# Patient Record
Sex: Male | Born: 1960 | Race: White | Hispanic: No | Marital: Married | Smoking: Never smoker
Health system: Southern US, Community
[De-identification: ages and names within clinical notes are randomized; demographics above are authoritative.]

## PROBLEM LIST (undated history)

## (undated) DIAGNOSIS — Z800 Family history of malignant neoplasm of digestive organs: Secondary | ICD-10-CM

## (undated) HISTORY — PX: APPENDECTOMY: SHX54

---

## 2004-01-31 ENCOUNTER — Emergency Department (HOSPITAL_COMMUNITY): Admission: EM | Admit: 2004-01-31 | Discharge: 2004-01-31 | Payer: Self-pay | Admitting: Emergency Medicine

## 2005-08-30 ENCOUNTER — Ambulatory Visit (HOSPITAL_COMMUNITY): Admission: RE | Admit: 2005-08-30 | Discharge: 2005-08-30 | Payer: Self-pay | Admitting: Family Medicine

## 2007-04-20 ENCOUNTER — Emergency Department (HOSPITAL_COMMUNITY): Admission: EM | Admit: 2007-04-20 | Discharge: 2007-04-20 | Payer: Self-pay | Admitting: Emergency Medicine

## 2010-12-15 ENCOUNTER — Emergency Department (HOSPITAL_COMMUNITY)
Admission: EM | Admit: 2010-12-15 | Discharge: 2010-12-15 | Payer: Self-pay | Source: Home / Self Care | Admitting: Family Medicine

## 2013-01-14 ENCOUNTER — Telehealth: Payer: Self-pay

## 2013-01-14 ENCOUNTER — Other Ambulatory Visit: Payer: Self-pay

## 2013-01-14 DIAGNOSIS — Z139 Encounter for screening, unspecified: Secondary | ICD-10-CM

## 2013-01-15 ENCOUNTER — Other Ambulatory Visit: Payer: Self-pay

## 2013-01-15 NOTE — Telephone Encounter (Signed)
Opened in error

## 2013-01-19 ENCOUNTER — Telehealth: Payer: Self-pay

## 2013-01-19 NOTE — Telephone Encounter (Signed)
Gastroenterology Pre-Procedure Form   Request Date: 01/18/2013    Requesting Physician: Dr. Sherwood Gambler      PATIENT INFORMATION:  Jonathan Serrano is a 52 y.o., male (DOB=1961/08/09).  PROCEDURE: Procedure(s) requested: colonoscopy Procedure Reason: screening for colon cancer  PATIENT REVIEW QUESTIONS: The patient reports the following:   1. Diabetes Melitis: no 2. Joint replacements in the past 12 months: no 3. Major health problems in the past 3 months: no 4. Has an artificial valve or MVP:no 5. Has been advised in past to take antibiotics in advance of a procedure like teeth cleaning: no}    MEDICATIONS & ALLERGIES:    Patient reports the following regarding taking any blood thinners:   Plavix? no Aspirin? YES Coumadin?  no  Patient confirms/reports the following medications:  Current Outpatient Prescriptions  Medication Sig Dispense Refill  . aspirin 81 MG tablet Take 81 mg by mouth daily.       No current facility-administered medications for this visit.    Patient confirms/reports the following allergies:  Allergies  Allergen Reactions  . Penicillins Hives    Patient is appropriate to schedule for requested procedure(s): yes  AUTHORIZATION INFORMATION Primary Insurance:   ID #:   Group #:  Pre-Cert / Auth required Pre-Cert / Auth #:   Secondary Insurance:   ID #:   Group #:  Pre-Cert / Auth required:} Pre-Cert / Auth #:   No orders of the defined types were placed in this encounter.    SCHEDULE INFORMATION: Procedure has been scheduled as follows:  Date: 02/03/2013     Time: 8:30 AM  Location: [facility] Short Stay  This Gastroenterology Pre-Precedure Form is being routed to the following provider(s) for review: R. Roetta Sessions, MD

## 2013-01-19 NOTE — Telephone Encounter (Signed)
Appropriate for procedure.  No need to hold ASA.

## 2013-01-20 ENCOUNTER — Telehealth: Payer: Self-pay

## 2013-01-20 MED ORDER — PEG-KCL-NACL-NASULF-NA ASC-C 100 G PO SOLR: 1.0000 | ORAL | Status: DC

## 2013-01-20 NOTE — Telephone Encounter (Signed)
Rx sent to [facility] and instructions mailed to pt.

## 2013-01-20 NOTE — Telephone Encounter (Signed)
I called [phone] ( [employer]) and spoke to [place] who said that a PA is not required for a screening colonoscopy.

## 2013-01-25 ENCOUNTER — Encounter (HOSPITAL_COMMUNITY): Payer: Self-pay | Admitting: Pharmacy Technician

## 2013-02-03 ENCOUNTER — Encounter (HOSPITAL_COMMUNITY)
Admission: RE | Disposition: A | Discharge status: Home / Self Care | Payer: Self-pay | Source: Ambulatory Visit | Attending: Internal Medicine

## 2013-02-03 ENCOUNTER — Ambulatory Visit (HOSPITAL_COMMUNITY)
Admission: RE | Admit: 2013-02-03 | Discharge: 2013-02-03 | Disposition: A | Discharge status: Home / Self Care | Payer: Managed Care, Other (non HMO) | Source: Ambulatory Visit | Attending: Internal Medicine | Admitting: Internal Medicine

## 2013-02-03 ENCOUNTER — Encounter (HOSPITAL_COMMUNITY): Payer: Self-pay | Admitting: *Deleted

## 2013-02-03 DIAGNOSIS — Z800 Family history of malignant neoplasm of digestive organs: Secondary | ICD-10-CM | POA: Insufficient documentation

## 2013-02-03 DIAGNOSIS — Z139 Encounter for screening, unspecified: Secondary | ICD-10-CM

## 2013-02-03 DIAGNOSIS — Z1211 Encounter for screening for malignant neoplasm of colon: Secondary | ICD-10-CM

## 2013-02-03 HISTORY — DX: Family history of malignant neoplasm of digestive organs: Z80.0

## 2013-02-03 HISTORY — PX: COLONOSCOPY: SHX5424

## 2013-02-03 SURGERY — COLONOSCOPY
Anesthesia: Moderate Sedation

## 2013-02-03 MED ORDER — MEPERIDINE HCL 100 MG/ML IJ SOLN
INTRAMUSCULAR | Status: DC | PRN
  Administered 2013-02-03: 50 mg via INTRAVENOUS
  Administered 2013-02-03: 25 mg via INTRAVENOUS

## 2013-02-03 MED ORDER — ONDANSETRON HCL 4 MG/2ML IJ SOLN
INTRAMUSCULAR | Status: AC
  Filled 2013-02-03: qty 2

## 2013-02-03 MED ORDER — ONDANSETRON HCL 4 MG/2ML IJ SOLN
INTRAMUSCULAR | Status: DC | PRN
  Administered 2013-02-03: 4 mg via INTRAVENOUS

## 2013-02-03 MED ORDER — MEPERIDINE HCL 100 MG/ML IJ SOLN
INTRAMUSCULAR | Status: AC
  Filled 2013-02-03: qty 2

## 2013-02-03 MED ORDER — MIDAZOLAM HCL 5 MG/5ML IJ SOLN
INTRAMUSCULAR | Status: AC
  Filled 2013-02-03: qty 10

## 2013-02-03 MED ORDER — MIDAZOLAM HCL 5 MG/5ML IJ SOLN
INTRAMUSCULAR | Status: DC | PRN
  Administered 2013-02-03: 2 mg via INTRAVENOUS
  Administered 2013-02-03: 1 mg via INTRAVENOUS
  Administered 2013-02-03: 2 mg via INTRAVENOUS

## 2013-02-03 MED ORDER — STERILE WATER FOR IRRIGATION IR SOLN
Status: DC | PRN
  Administered 2013-02-03: 08:00:00

## 2013-02-03 MED ORDER — SODIUM CHLORIDE 0.45 % IV SOLN
INTRAVENOUS | Status: DC
  Administered 2013-02-03: 09:00:00 via INTRAVENOUS

## 2013-02-03 NOTE — H&P (Signed)
  Primary Care Physician:  Cassell Smiles., MD Primary Gastroenterologist:  Dr. Jena Gauss  Pre-Procedure History & Physical: HPI:  Jonathan Serrano is a 52 y.o. male is here for a screening colonoscopy.  No prior colonoscopy. No bowel symptoms. Positive family history of colon cancer in patient's father but at an advanced age (55).  Past Medical History  Diagnosis Date  . Family history of colon cancer     father    Past Surgical History  Procedure Laterality Date  . Appendectomy      Prior to Admission medications   Medication Sig Start Date End Date Taking? Authorizing Provider  aspirin 81 MG tablet Take 81 mg by mouth daily.   Yes Historical Provider, MD  peg 3350 powder (MOVIPREP) 100 G SOLR Take 1 kit (100 g total) by mouth as directed. 01/20/13  Yes Corbin Ade, MD    Allergies as of 01/15/2013  . (Not on File)    History reviewed. No pertinent family history.  History   Social History  . Marital Status: Married    Spouse Name: N/A    Number of Children: N/A  . Years of Education: N/A   Occupational History  . Not on file.   Social History Main Topics  . Smoking status: Never Smoker   . Smokeless tobacco: Not on file  . Alcohol Use: Yes     Comment: occasional  . Drug Use: No  . Sexually Active: Not on file   Other Topics Concern  . Not on file   Social History Narrative  . No narrative on file    Review of Systems: See HPI, otherwise negative ROS  Physical Exam: BP 139/83  Pulse 86  Temp(Src) 97.8 F (36.6 C) (Oral)  Resp 18  Ht 5\' 11"  (1.803 m)  Wt 175 lb (79.379 kg)  BMI 24.42 kg/m2  SpO2 97% General:   Alert,  Well-developed, well-nourished, pleasant and cooperative in NAD Head:  Normocephalic and atraumatic. Eyes:  Sclera clear, no icterus.   Conjunctiva pink. Ears:  Normal auditory acuity. Nose:  No deformity, discharge,  or lesions. Mouth:  No deformity or lesions, dentition normal. Neck:  Supple; no masses or thyromegaly. Lungs:   Clear throughout to auscultation.   No wheezes, crackles, or rhonchi. No acute distress. Heart:  Regular rate and rhythm; no murmurs, clicks, rubs,  or gallops. Abdomen:  Positive bowel sounds. Soft nontender blockers we'll mass or organomegaly Msk:  Symmetrical without gross deformities. Normal posture. Pulses:  Normal pulses noted. Extremities:  Without clubbing or edema. Neurologic:  Alert and  oriented x4;  grossly normal neurologically. Skin:  Intact without significant lesions or rashes. Cervical Nodes:  No significant cervical adenopathy. Psych:  Alert and cooperative. Normal mood and affect.  Impression/Plan: Jonathan Serrano is now here to undergo a screening colonoscopy. This will be patient's first ever average risk screening examination.  Risks, benefits, limitations, imponderables and alternatives regarding colonoscopy have been reviewed with the patient. Questions have been answered. All parties agreeable.

## 2013-02-03 NOTE — Op Note (Signed)
[facility] [address] [place], [address]   COLONOSCOPY PROCEDURE REPORT  PATIENT: Jonathan Serrano, Jonathan Serrano  MR#:         [id number] BIRTHDATE: [birth year] , 51  yrs. old GENDER: Male ENDOSCOPIST: R.  Roetta Sessions, MD FACP FACG REFERRED BY:  Artis Delay, M.D. PROCEDURE DATE:  02/03/2013 PROCEDURE:     Screening ileocolonoscopy  INDICATIONS: First-ever average risk screening examination (father with colon cancer but at an advanced age)  INFORMED CONSENT:  The risks, benefits, alternatives and imponderables including but not limited to bleeding, perforation as well as the possibility of a missed lesion have been reviewed.  The potential for biopsy, lesion removal, etc. have also been discussed.  Questions have been answered.  All parties agreeable. Please see the history and physical in the medical record for more information.  MEDICATIONS: Versed 5 mg IV and Demerol 75 mg IV in divided doses. Zofran 4 mg IV  DESCRIPTION OF PROCEDURE:  After a digital rectal exam was performed, the Pentax Colonoscope [id number]  colonoscope was advanced from the anus through the rectum and colon to the area of the cecum, ileocecal valve and appendiceal orifice.  The cecum was deeply intubated.  These structures were well-seen and photographed for the record.  From the level of the cecum and ileocecal valve, the scope was slowly and cautiously withdrawn.  The mucosal surfaces were carefully surveyed utilizing scope tip deflection to facilitate fold flattening as needed.  The scope was pulled down into the rectum where a thorough examination including retroflexion was performed.    FINDINGS:  Adequate preparation. Normal rectum. Normal-appearing colonic mucosa (surgical changes at appendectomy site). Distal 10 cm of terminal ileal mucosa appeared normal.  THERAPEUTIC / DIAGNOSTIC MANEUVERS PERFORMED:  none  COMPLICATIONS: None  CECAL WITHDRAWAL TIME:  8 minutes  IMPRESSION:   Normal ileocolonoscopy-status post appendectomy  RECOMMENDATIONS: Repeat screening colonoscopy in 10 years   _______________________________ eSigned:  R. Roetta Sessions, MD FACP [facility] 02/03/2013 9:16 AM   CC:    PATIENT NAME:  Jonathan Serrano, Jonathan Serrano MR#: [id number]

## 2013-02-05 ENCOUNTER — Encounter (HOSPITAL_COMMUNITY): Payer: Self-pay | Admitting: Internal Medicine

## 2017-12-11 DIAGNOSIS — Z6827 Body mass index (BMI) 27.0-27.9, adult: Secondary | ICD-10-CM | POA: Diagnosis not present

## 2017-12-11 DIAGNOSIS — Z1389 Encounter for screening for other disorder: Secondary | ICD-10-CM | POA: Diagnosis not present

## 2017-12-11 DIAGNOSIS — Z0000 Encounter for general adult medical examination without abnormal findings: Secondary | ICD-10-CM | POA: Diagnosis not present

## 2017-12-11 DIAGNOSIS — E663 Overweight: Secondary | ICD-10-CM | POA: Diagnosis not present

## 2019-01-14 DIAGNOSIS — Z1389 Encounter for screening for other disorder: Secondary | ICD-10-CM | POA: Diagnosis not present

## 2019-01-14 DIAGNOSIS — E663 Overweight: Secondary | ICD-10-CM | POA: Diagnosis not present

## 2019-01-14 DIAGNOSIS — Z6827 Body mass index (BMI) 27.0-27.9, adult: Secondary | ICD-10-CM | POA: Diagnosis not present

## 2019-01-14 DIAGNOSIS — Z0000 Encounter for general adult medical examination without abnormal findings: Secondary | ICD-10-CM | POA: Diagnosis not present

## 2019-02-16 DIAGNOSIS — R972 Elevated prostate specific antigen [PSA]: Secondary | ICD-10-CM | POA: Diagnosis not present

## 2019-03-02 DIAGNOSIS — R972 Elevated prostate specific antigen [PSA]: Secondary | ICD-10-CM | POA: Diagnosis not present

## 2019-10-01 ENCOUNTER — Other Ambulatory Visit: Payer: Self-pay

## 2019-10-01 DIAGNOSIS — Z20822 Contact with and (suspected) exposure to covid-19: Secondary | ICD-10-CM

## 2019-10-03 ENCOUNTER — Telehealth: Payer: Self-pay

## 2019-10-03 NOTE — Telephone Encounter (Signed)
Pt called to check for covid results- advised results are not back yet.

## 2019-10-04 LAB — NOVEL CORONAVIRUS, NAA

## 2019-10-28 ENCOUNTER — Telehealth: Payer: Self-pay

## 2019-10-28 NOTE — Telephone Encounter (Signed)
Joey, PT from [facility] called requesting verbal orders for HHPT 1wk3. Orders approved and given per discharge summary.

## 2019-10-28 NOTE — Addendum Note (Signed)
Addended by: Jasmine December T on: 10/28/2019 01:08 PM   Modules accepted: Level of Service, SmartSet

## 2019-10-28 NOTE — Telephone Encounter (Signed)
Entered in error This encounter was created in error - please disregard. 

## 2021-03-07 ENCOUNTER — Ambulatory Visit: Payer: Managed Care, Other (non HMO) | Admitting: Urology

## 2021-03-13 ENCOUNTER — Other Ambulatory Visit: Payer: Self-pay

## 2021-03-13 ENCOUNTER — Encounter: Payer: Self-pay | Admitting: Urology

## 2021-03-13 ENCOUNTER — Ambulatory Visit: Payer: Managed Care, Other (non HMO) | Admitting: Urology

## 2021-03-13 ENCOUNTER — Ambulatory Visit (INDEPENDENT_AMBULATORY_CARE_PROVIDER_SITE_OTHER): Payer: No Typology Code available for payment source | Admitting: Urology

## 2021-03-13 VITALS — BP 134/73 | HR 71 | Temp 98.4°F | Ht 70.0 in | Wt 182.0 lb

## 2021-03-13 DIAGNOSIS — R972 Elevated prostate specific antigen [PSA]: Secondary | ICD-10-CM | POA: Diagnosis not present

## 2021-03-13 LAB — URINALYSIS, ROUTINE W REFLEX MICROSCOPIC
Bilirubin, UA: NEGATIVE
Glucose, UA: NEGATIVE
Ketones, UA: NEGATIVE
Leukocytes,UA: NEGATIVE
Nitrite, UA: NEGATIVE
Protein,UA: NEGATIVE
Specific Gravity, UA: 1.01 (ref 1.005–1.030)
Urobilinogen, Ur: 0.2 mg/dL (ref 0.2–1.0)
pH, UA: 6 (ref 5.0–7.5)

## 2021-03-13 LAB — MICROSCOPIC EXAMINATION
Renal Epithel, UA: NONE SEEN /hpf
WBC, UA: NONE SEEN /hpf (ref 0–5)

## 2021-03-13 NOTE — Progress Notes (Signed)
03/13/2021 2:03 PM   Haywood Filler 03-07-1961 [id number]  Referring provider: Sharilyn Sites, MD [address] [place],  [place] [address]  Elevated PSA  HPI: Mr Jonathan Serrano is 60yo here for evaluation of elevated PSA. His PSA range has been 3.8-4.1 over the last 3 years. PSA increased to 5.7 last year then on recheck was 3.9. IPSS 0 QOL 0. No family hx of nephrolithiasis. No other complaints today.    PMH: Past Medical History:  Diagnosis Date  . Family history of colon cancer    father    Surgical History: Past Surgical History:  Procedure Laterality Date  . APPENDECTOMY    . COLONOSCOPY N/A 02/03/2013   Procedure: COLONOSCOPY;  Surgeon: Daneil Dolin, MD;  Location[facility];  Service: Endoscopy;  Laterality: N/A;  8:30 AM    Home Medications:  Allergies as of 03/13/2021      Reactions   Penicillins Hives      Medication List       Accurate as of March 13, 2021  2:03 PM. If you have any questions, ask your nurse or doctor.        aspirin 81 MG tablet Take 81 mg by mouth daily.   peg 3350 powder 100 g Solr Commonly known as: MOVIPREP Take 1 kit (100 g total) by mouth as directed.       Allergies:  Allergies  Allergen Reactions  . Penicillins Hives    Family History: History reviewed. No pertinent family history.  Social History:  reports that he has never smoked. He does not have any smokeless tobacco history on file. He reports current alcohol use. He reports that he does not use drugs.  ROS: All other review of systems were reviewed and are negative except what is noted above in HPI  Physical Exam: BP 134/73   Pulse 71   Temp 98.4 F (36.9 C)   Ht 5' 10"  (1.778 m)   Wt 182 lb (82.6 kg)   BMI 26.11 kg/m   Constitutional:  Alert and oriented, No acute distress. HEENT:  AT, moist mucus membranes.  Trachea midline, no masses. Cardiovascular: No clubbing, cyanosis, or edema. Respiratory: Normal respiratory effort, no increased work of  breathing. GI: Abdomen is soft, nontender, nondistended, no abdominal masses GU: No CVA tenderness. Circumcised phallus. No masses/lesions on penis, testis, scrotum. Prostate 40g smooth no nodules no induration.  Lymph: No cervical or inguinal lymphadenopathy. Skin: No rashes, bruises or suspicious lesions. Neurologic: Grossly intact, no focal deficits, moving all 4 extremities. Psychiatric: Normal mood and affect.  Laboratory Data: No results found for: WBC, HGB, HCT, MCV, PLT  No results found for: CREATININE  No results found for: PSA  No results found for: TESTOSTERONE  No results found for: HGBA1C  Urinalysis No results found for: COLORURINE, APPEARANCEUR, LABSPEC, PHURINE, GLUCOSEU, HGBUR, BILIRUBINUR, KETONESUR, PROTEINUR, UROBILINOGEN, NITRITE, LEUKOCYTESUR  No results found for: LABMICR, [place], RBCUA, LABEPIT, MUCUS, BACTERIA  Pertinent Imaging:  No results found for this or any previous visit.  No results found for this or any previous visit.  No results found for this or any previous visit.  No results found for this or any previous visit.  No results found for this or any previous visit.  No results found for this or any previous visit.  No results found for this or any previous visit.  No results found for this or any previous visit.   Assessment & Plan:    1. Elevated PSA -The patient  and I talked about etiologies of elevated PSA.  We discussed the possible relationship between elevated PSA and prostate cancer, BPH, prostatitis, infection trauma and recent ejaculations.  The patient's PSA has been relatively stable over the interval and as such I recommended that we follow-up with a repeat PSA in 6 months.  If it remains elevated with a positive rising trend we will discuss prostate biopsy at his follow-up appointment. - Urinalysis, Routine w reflex microscopic   No follow-ups on file.  Nicolette Bang, MD  [facility] [place]

## 2021-03-13 NOTE — Progress Notes (Signed)

## 2021-03-14 LAB — PSA, TOTAL AND FREE
PSA, Free Pct: 16.5 %
PSA, Free: 0.79 ng/mL
Prostate Specific Ag, Serum: 4.8 ng/mL — ABNORMAL HIGH (ref 0.0–4.0)

## 2021-05-09 NOTE — Progress Notes (Signed)
Results mailed with appts.

## 2021-09-05 ENCOUNTER — Other Ambulatory Visit: Payer: No Typology Code available for payment source

## 2021-09-05 ENCOUNTER — Other Ambulatory Visit: Payer: Self-pay

## 2021-09-05 DIAGNOSIS — R972 Elevated prostate specific antigen [PSA]: Secondary | ICD-10-CM

## 2021-09-06 LAB — PSA, TOTAL AND FREE
PSA, Free Pct: 18.2 %
PSA, Free: 0.93 ng/mL
Prostate Specific Ag, Serum: 5.1 ng/mL — ABNORMAL HIGH (ref 0.0–4.0)

## 2021-09-11 NOTE — Progress Notes (Signed)
Sent via mychart

## 2021-09-12 ENCOUNTER — Ambulatory Visit (INDEPENDENT_AMBULATORY_CARE_PROVIDER_SITE_OTHER): Payer: No Typology Code available for payment source | Admitting: Urology

## 2021-09-12 ENCOUNTER — Other Ambulatory Visit: Payer: Self-pay

## 2021-09-12 ENCOUNTER — Encounter: Payer: Self-pay | Admitting: Urology

## 2021-09-12 VITALS — BP 115/68 | HR 88

## 2021-09-12 DIAGNOSIS — R972 Elevated prostate specific antigen [PSA]: Secondary | ICD-10-CM

## 2021-09-12 LAB — URINALYSIS, ROUTINE W REFLEX MICROSCOPIC
Bilirubin, UA: NEGATIVE
Glucose, UA: NEGATIVE
Ketones, UA: NEGATIVE
Leukocytes,UA: NEGATIVE
Nitrite, UA: NEGATIVE
Protein,UA: NEGATIVE
Specific Gravity, UA: 1.025 (ref 1.005–1.030)
Urobilinogen, Ur: 0.2 mg/dL (ref 0.2–1.0)
pH, UA: 5.5 (ref 5.0–7.5)

## 2021-09-12 LAB — MICROSCOPIC EXAMINATION
Bacteria, UA: NONE SEEN
Renal Epithel, UA: NONE SEEN /hpf

## 2021-09-12 NOTE — Progress Notes (Signed)
09/12/2021 1:32 PM   Jonathan Serrano 1961-09-11 [id number]  Referring provider: Elfredia Nevins, MD [address] [place],  [place] [address]  Elevated PSA   HPI: Jonathan Serrano is a 59yo here for followup for elevated PSA. PSA increased to 5.1 from 4.9. He LUTS have improved since last visit since he started drinking more water. No other complaints today   PMH: Past Medical History:  Diagnosis Date   Family history of colon cancer    father    Surgical History: Past Surgical History:  Procedure Laterality Date   APPENDECTOMY     COLONOSCOPY N/A 02/03/2013   Procedure: COLONOSCOPY;  Surgeon: Corbin Ade, MD;  Location: [facility];  Service: Endoscopy;  Laterality: N/A;  8:30 AM    Home Medications:  Allergies as of 09/12/2021       Reactions   Penicillins Hives        Medication List        Accurate as of September 12, 2021  1:32 PM. If you have any questions, ask your nurse or doctor.          STOP taking these medications    peg 3350 powder 100 g Solr Commonly known as: MOVIPREP Stopped by: Wilkie Aye, MD       TAKE these medications    aspirin 81 MG tablet Take 81 mg by mouth daily.        Allergies:  Allergies  Allergen Reactions   Penicillins Hives    Family History: History reviewed. No pertinent family history.  Social History:  reports current alcohol use. He reports that he does not use drugs. No history on file for tobacco use.  ROS: All other review of systems were reviewed and are negative except what is noted above in HPI  Physical Exam: BP 115/68   Pulse 88   Constitutional:  Alert and oriented, No acute distress. HEENT: [place] AT, moist mucus membranes.  Trachea midline, no masses. Cardiovascular: No clubbing, cyanosis, or edema. Respiratory: Normal respiratory effort, no increased work of breathing. GI: Abdomen is soft, nontender, nondistended, no abdominal masses GU: No CVA tenderness.  Lymph: No cervical or  inguinal lymphadenopathy. Skin: No rashes, bruises or suspicious lesions. Neurologic: Grossly intact, no focal deficits, moving all 4 extremities. Psychiatric: Normal mood and affect.  Laboratory Data: No results found for: WBC, HGB, HCT, MCV, PLT  No results found for: CREATININE  No results found for: PSA  No results found for: TESTOSTERONE  No results found for: HGBA1C  Urinalysis    Component Value Date/Time   APPEARANCEUR Clear 03/13/2021 1326   GLUCOSEU Negative 03/13/2021 1326   BILIRUBINUR Negative 03/13/2021 1326   PROTEINUR Negative 03/13/2021 1326   NITRITE Negative 03/13/2021 1326   LEUKOCYTESUR Negative 03/13/2021 1326    Lab Results  Component Value Date   LABMICR See below: 03/13/2021   WBCUA None seen 03/13/2021   LABEPIT 0-10 03/13/2021   BACTERIA Few (A) 03/13/2021    Pertinent Imaging:  No results found for this or any previous visit.  No results found for this or any previous visit.  No results found for this or any previous visit.  No results found for this or any previous visit.  No results found for this or any previous visit.  No results found for this or any previous visit.  No results found for this or any previous visit.  No results found for this or any previous visit.   Assessment & Plan:  1. Elevated PSA -RTC 6 months with PSA - Urinalysis, Routine w reflex microscopic   Return in about 6 months (around 03/13/2022) for psa.  [place]kie Aye, MD  [facility] [place]

## 2021-09-12 NOTE — Progress Notes (Signed)

## 2021-09-12 NOTE — Patient Instructions (Signed)
Prostate-Specific Antigen Test Why am I having this test? The prostate-specific antigen (PSA) test is a screening test for prostate cancer. It can identify early signs of prostate cancer, which may allow for more effective treatment. Your health care provider may recommend that you have a PSA test starting at age 60 or that you have one earlier or later, depending on your risk factors for prostate cancer. You may also have a PSA test: To monitor treatment of prostate cancer. To check whether prostate cancer has returned after treatment. If you have signs of other conditions that can affect PSA levels, such as: An enlarged prostate that is not caused by cancer (benign prostatic hyperplasia, BPH). This condition is very common in older men. A prostate infection. What is being tested? This test measures the amount of PSA in your blood. PSA is a protein that is made in the prostate. The prostate naturally produces more PSA as you age, but very high levels may be a sign of a medical condition. What kind of sample is taken? A blood sample is required for this test. It is usually collected by inserting a needle into a blood vessel or by sticking a finger with a small needle. Blood for this test should be drawn before having an exam of the prostate. How do I prepare for this test? Do not ejaculate starting 24 hours before your test, or as long as told by your health care provider. Tell a health care provider about: Any allergies you have. All medicines you are taking, including vitamins, herbs, eye drops, creams, and over-the-counter medicines. This also includes: Medicines to assist with hair growth, such as finasteride. Any recent exposure to a medicine called diethylstilbestrol. Any blood disorders you have. Any recent procedures you have had, especially any procedures involving the prostate or rectum. Any medical conditions you have. Any recent urinary tract infections (UTIs) you have had. How are  the results reported? Your test results will be reported as a value that indicates how much PSA is in your blood. This will be given as nanograms of PSA per milliliter of blood (ng/mL). Your health care provider will compare your results to normal ranges that were established after testing a large group of people (reference ranges). Reference ranges may vary among labs and hospitals. PSA levels vary from person to person and generally increase with age. Because of this variation, there is no single PSA value that is considered normal for everyone. Instead, PSA reference ranges are used to describe whether your PSA levels are considered low or high (elevated). Common reference ranges are: Low: 0-2.5 ng/mL. Slightly to moderately elevated: 2.6-10.0 ng/mL. Moderately elevated: 10.0-19.9 ng/mL. Significantly elevated: 20 ng/mL or greater. Sometimes, the test results may report that a condition is present when it is not present (false-positive result). What do the results mean? A test result that is higher than 4 ng/mL may mean that you are at an increased risk for prostate cancer. However, a PSA test by itself is not enough to diagnose prostate cancer. High PSA levels may also be caused by the natural aging process, prostate infection, or BPH. PSA screening cannot tell you if your PSA is high due to cancer or a different cause. A prostate biopsy is the only way to diagnose prostate cancer. A risk of having the PSA test is diagnosing and treating prostate cancer that would never have caused any symptoms or problems (overdiagnosis and overtreatment). Talk with your health care provider about what your results mean. Questions   to ask your health care provider Ask your health care provider, or the department that is doing the test: When will my results be ready? How will I get my results? What are my treatment options? What other tests do I need? What are my next steps? Summary The prostate-specific  antigen (PSA) test is a screening test for prostate cancer. Your health care provider may recommend that you have a PSA test starting at age 60 or that you have one earlier or later, depending on your risk factors for prostate cancer. A test result that is higher than 4 ng/mL may mean that you are at an increased risk for prostate cancer. However, elevated levels can be caused by a number of conditions other than prostate cancer. Talk with your health care provider about what your results mean. This information is not intended to replace advice given to you by your health care provider. Make sure you discuss any questions you have with your health care provider. Document Revised: 08/10/2020 Document Reviewed: 08/10/2020 Elsevier Patient Education  2022 [employer]  

## 2021-10-14 ENCOUNTER — Emergency Department (HOSPITAL_COMMUNITY): Payer: No Typology Code available for payment source

## 2021-10-14 ENCOUNTER — Encounter (HOSPITAL_COMMUNITY): Payer: Self-pay | Admitting: Emergency Medicine

## 2021-10-14 ENCOUNTER — Emergency Department (HOSPITAL_COMMUNITY)
Admission: EM | Admit: 2021-10-14 | Discharge: 2021-10-14 | Disposition: A | Discharge status: Home / Self Care | Payer: No Typology Code available for payment source | Attending: Emergency Medicine | Admitting: Emergency Medicine

## 2021-10-14 DIAGNOSIS — M79672 Pain in left foot: Secondary | ICD-10-CM | POA: Diagnosis present

## 2021-10-14 DIAGNOSIS — Z7982 Long term (current) use of aspirin: Secondary | ICD-10-CM | POA: Insufficient documentation

## 2021-10-14 MED ORDER — COLCHICINE 0.6 MG PO TABS
1.2000 mg | ORAL_TABLET | ORAL | Status: AC
  Administered 2021-10-14: 1.2 mg via ORAL
  Filled 2021-10-14: qty 2

## 2021-10-14 MED ORDER — COLCHICINE 0.6 MG PO TABS: 0.6000 mg | ORAL_TABLET | Freq: Two times a day (BID) | ORAL | 0 refills | Status: DC

## 2021-10-14 NOTE — Discharge Instructions (Signed)
Your testing with your x-ray is normal, there is no signs of broken bones or pathologic fractures or other abnormalities.  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  Otherwise please follow-up with your doctor in 1 week if no improvement  ER for worsening pain / swelling / fever

## 2021-10-14 NOTE — ED Provider Notes (Signed)
[facility] EMERGENCY DEPARTMENT Provider Note   CSN: [id number] Arrival date & time: 10/14/21  2135     History Chief Complaint  Patient presents with   Foot Pain    Jonathan Serrano is a 60 y.o. male.   Foot Pain   This patient is a very pleasant 60 year old male, presenting with a complaint of left-sided foot pain.  He reports that recently he developed some pain over the lateral aspect of his foot, he cannot recall a specific instigating factor or traumatic event, that seem to go away.  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  This pain has been present since that time, it is worse with walking, it is associated with swelling but no fevers.  There are no other joints that are being bothered, denies a history of gout or other inflammatory arthritis.  Past Medical History:  Diagnosis Date   Family history of colon cancer    father    Patient Active Problem List   Diagnosis Date Noted   Elevated PSA 03/13/2021    Past Surgical History:  Procedure Laterality Date   APPENDECTOMY     COLONOSCOPY N/A 02/03/2013   Procedure: COLONOSCOPY;  Surgeon: Corbin Ade, MD;  Location: [facility];  Service: Endoscopy;  Laterality: N/A;  8:30 AM       History reviewed. No pertinent family history.  Social History   Tobacco Use   Smoking status: Never  Substance Use Topics   Alcohol use: Yes    Comment: occasional   Drug use: No    Home Medications Prior to Admission medications   Medication Sig Start Date End Date Taking? Authorizing Provider  aspirin 81 MG tablet Take 81 mg by mouth daily.    [provider]    Allergies    Penicillins  Review of Systems   Review of Systems  Constitutional:  Negative for fever.  Musculoskeletal:  Positive for arthralgias.   Physical Exam Updated Vital  Signs BP (!) 150/73 (BP Location: Right Arm)   Pulse 79   Temp 97.8 F (36.6 C) (Oral)   Resp 17   Ht 1.778 m (5\' 10" )   Wt 81.6 kg   SpO2 100%   BMI 25.83 kg/m   Physical Exam Vitals and nursing note reviewed.  Constitutional:      Appearance: He is well-developed. He is not diaphoretic.  HENT:     Head: Normocephalic and atraumatic.  Eyes:     General:        Right eye: No discharge.        Left eye: No discharge.     Conjunctiva/sclera: Conjunctivae normal.  Pulmonary:     Effort: Pulmonary effort is normal. No respiratory distress.  Musculoskeletal:     Right lower leg: No edema.     Left lower leg: No edema.     Comments: There is tenderness to palpation over the second toe of the left foot.  There is mild swelling on the dorsum of the foot in the same area, there is no redness or warmth, there is no obvious foreign bodies on the top or the bottom of the foot.  Skin:    General: Skin is warm and dry.     Findings: No erythema or rash.  Neurological:     Mental Status: He is alert.     Coordination: Coordination normal.    ED Results / Procedures / Treatments   Labs (all labs ordered are listed, but only abnormal results are displayed) Labs Reviewed - No data to display  EKG None  Radiology No results found.  Procedures Procedures   Medications Ordered in ED Medications  colchicine tablet 1.2 mg (has no administration in time range)    ED Course  I have reviewed the triage vital signs and the nursing notes.  Pertinent labs & imaging results that were available during my care of the patient were reviewed by me and considered in my medical decision making (see chart for details).    MDM Rules/Calculators/A&P                           Has isolated joint pain which seems to be the second toe of the left foot at the MTP joint however given the swelling of the dorsum of the foot and the pain this patient worse today with no prior history of joint  arthropathy we will obtain an imaging of the left foot, give 1.2 mg of colchicine, anticipate discharge after imaging.  Would also consider some type of pathologic fracture, tumor or gout findings on x-ray  Xray neg  Final Clinical Impression(s) / ED Diagnoses Final diagnoses:  Foot pain, left    Rx / DC Orders ED Discharge Orders     None        Eber Hong, MD 10/19/21 1431

## 2021-10-14 NOTE — ED Triage Notes (Signed)
Pt c/o left foot pain on and off for the past few months. Then today while at work pt began having severe left foot pain. Now with swelling from foot up to ankle.

## 2021-11-19 ENCOUNTER — Other Ambulatory Visit: Payer: Self-pay

## 2021-11-19 ENCOUNTER — Ambulatory Visit: Payer: No Typology Code available for payment source | Admitting: Orthopedic Surgery

## 2021-11-19 ENCOUNTER — Encounter: Payer: Self-pay | Admitting: Orthopedic Surgery

## 2021-11-19 ENCOUNTER — Ambulatory Visit: Payer: No Typology Code available for payment source

## 2021-11-19 VITALS — BP 142/78 | HR 74 | Ht 70.0 in | Wt 189.0 lb

## 2021-11-19 DIAGNOSIS — M19072 Primary osteoarthritis, left ankle and foot: Secondary | ICD-10-CM

## 2021-11-19 DIAGNOSIS — M25572 Pain in left ankle and joints of left foot: Secondary | ICD-10-CM

## 2021-11-19 NOTE — Progress Notes (Addendum)
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 States had injection at PCP office for [facility] neuroma/ also told ? Gout     60 year old male with a cloudy history in terms of pain in his left foot he seems to have had some difficulty on a treadmill and then had some metatarsal pain in the fourth and the second digit which eventually progressed to swelling and was treated with colchicine and then at the primary care doctor office with injection for neuroma with fairly good result however during this entire time which has been a period of about a month he continues to have antrolateral ankle pain inability to run and cannot walk on the treadmill greater than 3.5 mph  He had similar issues on the right side years ago has been using good shoes has some over-the-counter orthotics not sure if this was exactly the same thing or not seems similar     Review of Systems  All other systems reviewed and are negative.   Body mass index is 27.12 kg/m.  Physical Exam Constitutional:      General: He is not in acute distress.    Appearance: He is well-developed.     Comments: Well developed, well nourished Normal grooming and hygiene     Cardiovascular:     Comments: No peripheral edema Musculoskeletal:     Comments: Definite pes planus.  No skin abnormalities.  Tenderness is noted over the anterolateral  and subtalar joints  There is some discomfort on the medial side as well  Ankle stability is normal  Inversion eversion motion is still maintained  No atrophy of the musculature  Skin:    General: Skin is warm and dry.  Neurological:     Mental Status: He is alert and oriented to person, place, and time.     Sensory: No sensory deficit.     Coordination: Coordination normal.     Gait: Gait normal.     Deep Tendon Reflexes: Reflexes are normal and symmetric.  Psychiatric:        Mood and Affect: Mood normal.        Behavior: Behavior normal.        Thought Content: Thought content normal.        Judgment: Judgment normal.     Comments: Affect normal     Past Medical History:  Diagnosis Date   Family history of colon cancer    father   Past Surgical History:  Procedure Laterality Date   APPENDECTOMY     COLONOSCOPY N/A 02/03/2013   Procedure: COLONOSCOPY;  Surgeon: Corbin Ade, MD;  Location: [facility];  Service: Endoscopy;  Laterality: N/A;  8:30 AM   Social History   Tobacco Use   Smoking status: Never  Substance Use Topics   Alcohol use: Yes    Comment: occasional   Drug use: No  Assessment and Plan:  Images: Left ankle x-rays were negative  Foot x-rays were done at [facility].  I was able to read those.  There is no evidence of fracture or dislocation  Encounter Diagnoses  Name Primary?   Pain in left ankle and joints of left foot Yes   Arthritis of left subtalar joint    Injection return 3 weeks continue good supportive shoe wear and orthotics

## 2021-12-17 ENCOUNTER — Encounter: Payer: Self-pay | Admitting: Orthopedic Surgery

## 2021-12-17 ENCOUNTER — Ambulatory Visit: Payer: No Typology Code available for payment source | Admitting: Orthopedic Surgery

## 2021-12-17 ENCOUNTER — Other Ambulatory Visit: Payer: Self-pay

## 2021-12-17 VITALS — BP 125/72 | HR 75 | Ht 70.0 in | Wt 190.8 lb

## 2021-12-17 DIAGNOSIS — M25572 Pain in left ankle and joints of left foot: Secondary | ICD-10-CM

## 2021-12-17 NOTE — Patient Instructions (Signed)
CONTINUE WITH YOUR CURRENT ROUTINE IF SYMPTOMS BECOME INTOLERABLE CALL us BACK TO DISCUSS FURTHE INTERVENTION

## 2021-12-17 NOTE — Progress Notes (Signed)
Chief Complaint  Patient presents with   Foot Pain    Not normal but better   Encounter Diagnosis  Name Primary?   Pain in left ankle and joints of left foot Yes     61 year old male who had an unusual constellation of symptoms in his left foot.  He has a pes planus.  He has lateral and medial pain  He did get a subtalar injection said it did not help he also got an injection for Morton's neuroma which did help  He still has pain in the foot but it is a little bit better with his new orthotics from the foot store  The pain is dorsal lateral mild medial pain over the posterior tibial tendon.  This is a flexible deformity  Recommend he continue with the orthotics.  He can exercise and see if he can tolerate it and if so no other interventions need to be done if not then foot and ankle surgeon would be needed to consult

## 2022-03-12 ENCOUNTER — Other Ambulatory Visit: Payer: No Typology Code available for payment source

## 2022-03-12 ENCOUNTER — Telehealth: Payer: Self-pay

## 2022-03-12 DIAGNOSIS — R972 Elevated prostate specific antigen [PSA]: Secondary | ICD-10-CM

## 2022-03-12 NOTE — Telephone Encounter (Signed)
Patient came in today for 6 month PSA did you want to call patient with results or have him come back in office.  ?

## 2022-03-13 ENCOUNTER — Other Ambulatory Visit: Payer: No Typology Code available for payment source

## 2022-03-13 LAB — PSA, TOTAL AND FREE
PSA, Free Pct: 15.7 %
PSA, Free: 0.8 ng/mL
Prostate Specific Ag, Serum: 5.1 ng/mL — ABNORMAL HIGH (ref 0.0–4.0)

## 2022-03-19 ENCOUNTER — Telehealth: Payer: Self-pay

## 2022-03-19 DIAGNOSIS — R972 Elevated prostate specific antigen [PSA]: Secondary | ICD-10-CM

## 2022-03-19 NOTE — Telephone Encounter (Signed)
Patient called advising that he had his PSA drawn 03/12/22 and wanted to know if Dr. Ronne Binning wanted him to follow up or did he want to continue to monitoring his levels every 6 months.  ?

## 2022-03-20 NOTE — Telephone Encounter (Signed)
Reviewed PSA with Dr. Ronne Binning- okay to see patient back in 6 months with repeat PSA prior. Lab and office visit mailed to patient. Patient informed of recent PSA and voiced understanding.  ?

## 2022-09-09 ENCOUNTER — Other Ambulatory Visit: Payer: No Typology Code available for payment source

## 2022-09-16 ENCOUNTER — Ambulatory Visit: Payer: No Typology Code available for payment source | Admitting: Urology

## 2022-09-17 ENCOUNTER — Ambulatory Visit: Payer: No Typology Code available for payment source | Admitting: Urology

## 2022-09-23 ENCOUNTER — Other Ambulatory Visit: Payer: No Typology Code available for payment source

## 2022-09-23 DIAGNOSIS — R972 Elevated prostate specific antigen [PSA]: Secondary | ICD-10-CM

## 2022-09-24 ENCOUNTER — Telehealth: Payer: Self-pay

## 2022-09-24 LAB — PSA: Prostate Specific Ag, Serum: 5.2 ng/mL — ABNORMAL HIGH (ref 0.0–4.0)

## 2022-09-24 NOTE — Telephone Encounter (Signed)
Patient called with questions on his lab work he had done on 10/16.  He has results of his PSA but states he needed the PSA free/total.  Confirmed with Dr. Alyson Ingles that patient did need free and total PSA.  Called [facility] to request the PSA free/ total be added to results.  They will send over confirmation through fax.  Patient informed and will f/u as scheduled.

## 2022-09-27 ENCOUNTER — Ambulatory Visit: Payer: No Typology Code available for payment source | Admitting: Urology

## 2022-09-27 VITALS — BP 123/71 | HR 65

## 2022-09-27 DIAGNOSIS — R972 Elevated prostate specific antigen [PSA]: Secondary | ICD-10-CM

## 2022-09-27 DIAGNOSIS — R351 Nocturia: Secondary | ICD-10-CM

## 2022-09-27 LAB — URINALYSIS, ROUTINE W REFLEX MICROSCOPIC
Bilirubin, UA: NEGATIVE
Glucose, UA: NEGATIVE
Ketones, UA: NEGATIVE
Leukocytes,UA: NEGATIVE
Nitrite, UA: NEGATIVE
Protein,UA: NEGATIVE
RBC, UA: NEGATIVE
Specific Gravity, UA: 1.025 (ref 1.005–1.030)
Urobilinogen, Ur: 0.2 mg/dL (ref 0.2–1.0)
pH, UA: 5 (ref 5.0–7.5)

## 2022-09-27 NOTE — Patient Instructions (Signed)

## 2022-09-27 NOTE — Progress Notes (Unsigned)
09/27/2022 12:14 PM   Zenia Resides Margarito Courser January 20, 1961 [id number]  Referring provider: Redmond School, MD [address] [place],  [place] [address]  Followup elevated PSA   HPI: Mr Griffie is a 61yo here for followup for elevated PSA. PSA stable at 5.2. No significant LUTS. Nocturia 0-1x. Urine stream strong. No other complaints today   PMH: Past Medical History:  Diagnosis Date   Family history of colon cancer    father    Surgical History: Past Surgical History:  Procedure Laterality Date   APPENDECTOMY     COLONOSCOPY N/A 02/03/2013   Procedure: COLONOSCOPY;  Surgeon: Daneil Dolin, MD;  Location: [facility];  Service: Endoscopy;  Laterality: N/A;  8:30 AM    Home Medications:  Allergies as of 09/27/2022       Reactions   Penicillins Hives        Medication List        Accurate as of September 27, 2022 12:14 PM. If you have any questions, ask your nurse or doctor.          aspirin 81 MG tablet Take 81 mg by mouth daily.        Allergies:  Allergies  Allergen Reactions   Penicillins Hives    Family History: No family history on file.  Social History:  reports that he has never smoked. He does not have any smokeless tobacco history on file. He reports current alcohol use. He reports that he does not use drugs.  ROS: All other review of systems were reviewed and are negative except what is noted above in HPI  Physical Exam: BP 123/71   Pulse 65   Constitutional:  Alert and oriented, No acute distress. HEENT: [place] AT, moist mucus membranes.  Trachea midline, no masses. Cardiovascular: No clubbing, cyanosis, or edema. Respiratory: Normal respiratory effort, no increased work of breathing. GI: Abdomen is soft, nontender, nondistended, no abdominal masses GU: No CVA tenderness.  Lymph: No cervical or inguinal lymphadenopathy. Skin: No rashes, bruises or suspicious lesions. Neurologic: Grossly intact, no focal deficits, moving all 4  extremities. Psychiatric: Normal mood and affect.  Laboratory Data: No results found for: "WBC", "HGB", "HCT", "MCV", "PLT"  No results found for: "CREATININE"  No results found for: "PSA"  No results found for: "TESTOSTERONE"  No results found for: "HGBA1C"  Urinalysis    Component Value Date/Time   APPEARANCEUR Clear 09/12/2021 1346   GLUCOSEU Negative 09/12/2021 1346   BILIRUBINUR Negative 09/12/2021 1346   PROTEINUR Negative 09/12/2021 1346   NITRITE Negative 09/12/2021 1346   LEUKOCYTESUR Negative 09/12/2021 1346    Lab Results  Component Value Date   LABMICR See below: 09/12/2021   WBCUA 0-5 09/12/2021   LABEPIT 0-10 09/12/2021   BACTERIA None seen 09/12/2021    Pertinent Imaging:  No results found for this or any previous visit.  No results found for this or any previous visit.  No results found for this or any previous visit.  No results found for this or any previous visit.  No results found for this or any previous visit.  No valid procedures specified. No results found for this or any previous visit.  No results found for this or any previous visit.   Assessment & Plan:    1. Elevated PSA -RTC 6 monhs with free and total PSA - Urinalysis, Routine w reflex microscopic  2. Nocturia -fluid management prior to going to bed   No follow-ups on file.  Nicolette Bang, MD  [facility] [place]

## 2022-10-03 ENCOUNTER — Encounter: Payer: Self-pay | Admitting: Urology

## 2022-10-16 LAB — PSA TOTAL (REFLEX TO FREE): Prostate Specific Ag, Serum: 5.4 ng/mL — ABNORMAL HIGH (ref 0.0–4.0)

## 2022-10-16 LAB — FPSA% REFLEX
% FREE PSA: 17.2 %
PSA, FREE: 0.93 ng/mL

## 2022-10-16 LAB — SPECIMEN STATUS REPORT

## 2022-12-27 ENCOUNTER — Encounter: Payer: Self-pay | Admitting: *Deleted

## 2023-01-01 IMAGING — DX DG FOOT COMPLETE 3+V*L*
3 series · 3 of 3 positions shown · non-contrast
Comparison: None.

CLINICAL DATA: Pain and swelling

EXAM:
LEFT FOOT - COMPLETE 3+ VIEW

[foot ap]
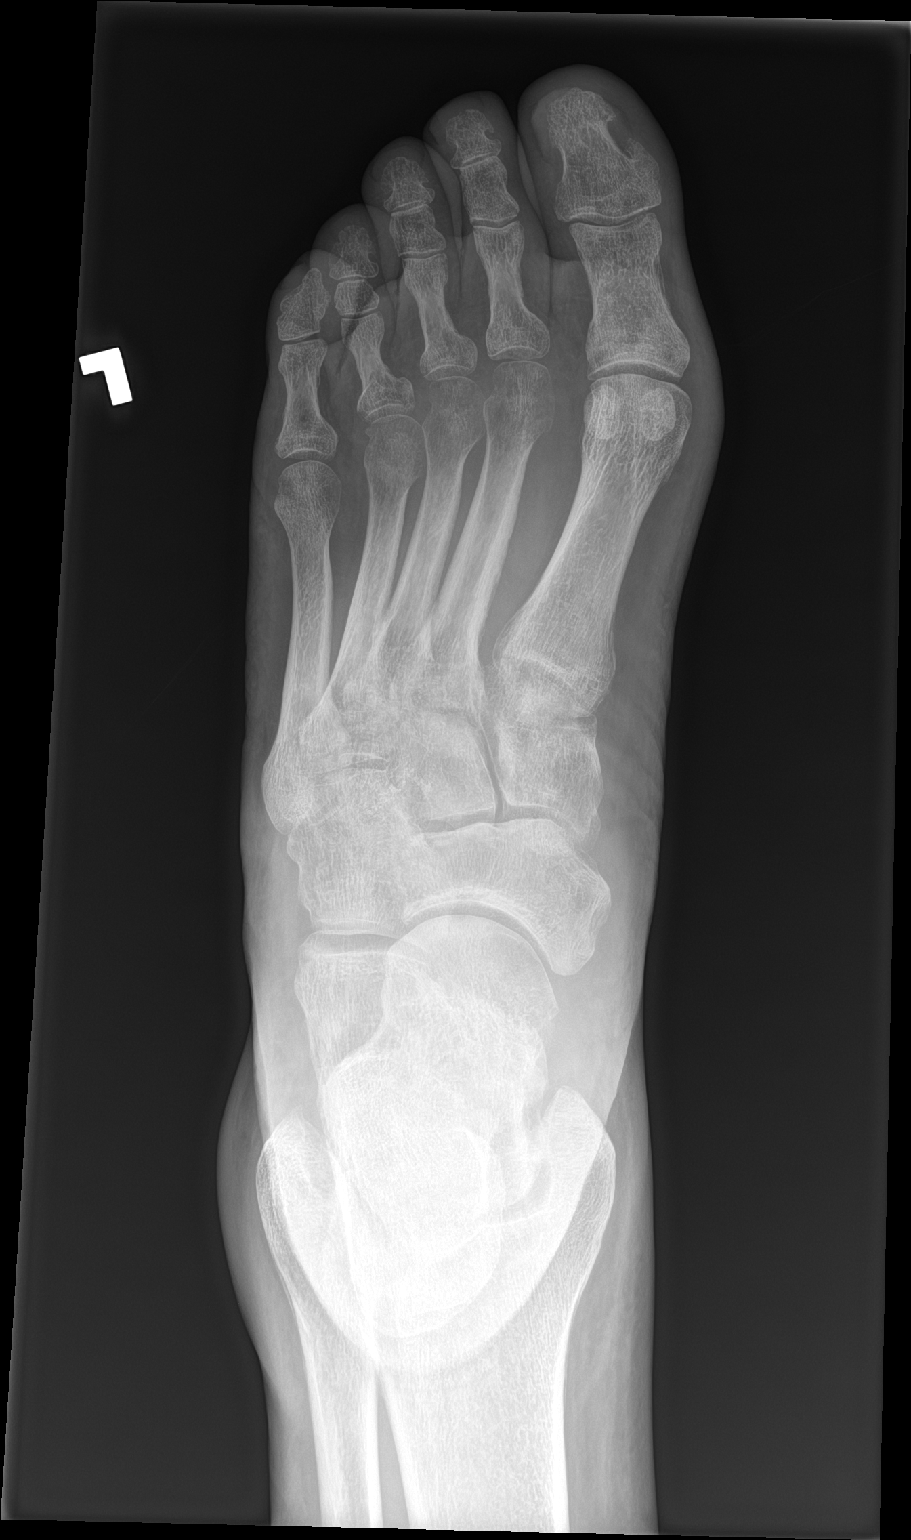

[foot obl]
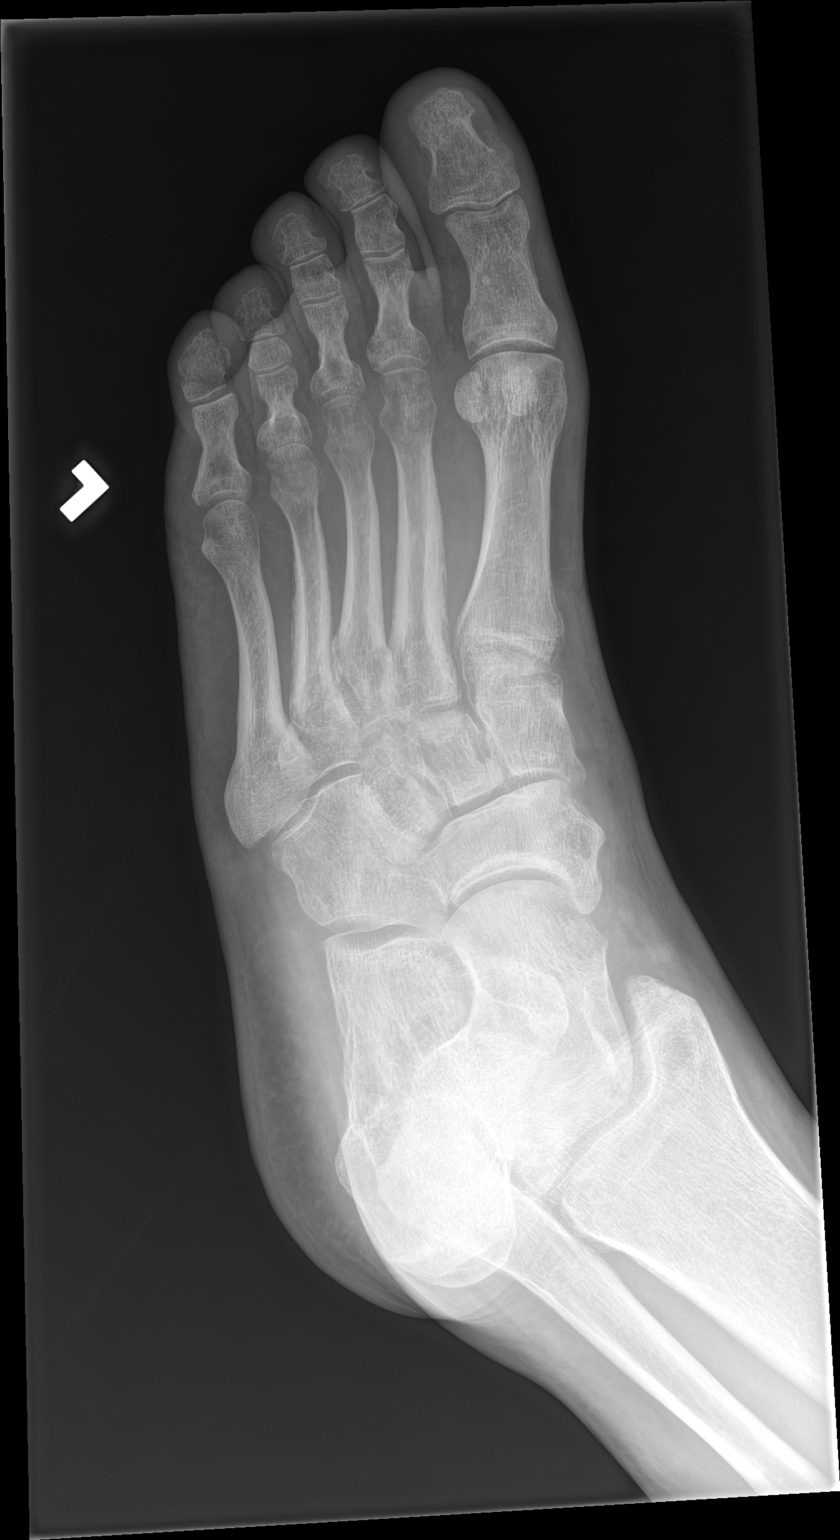

[foot lat]
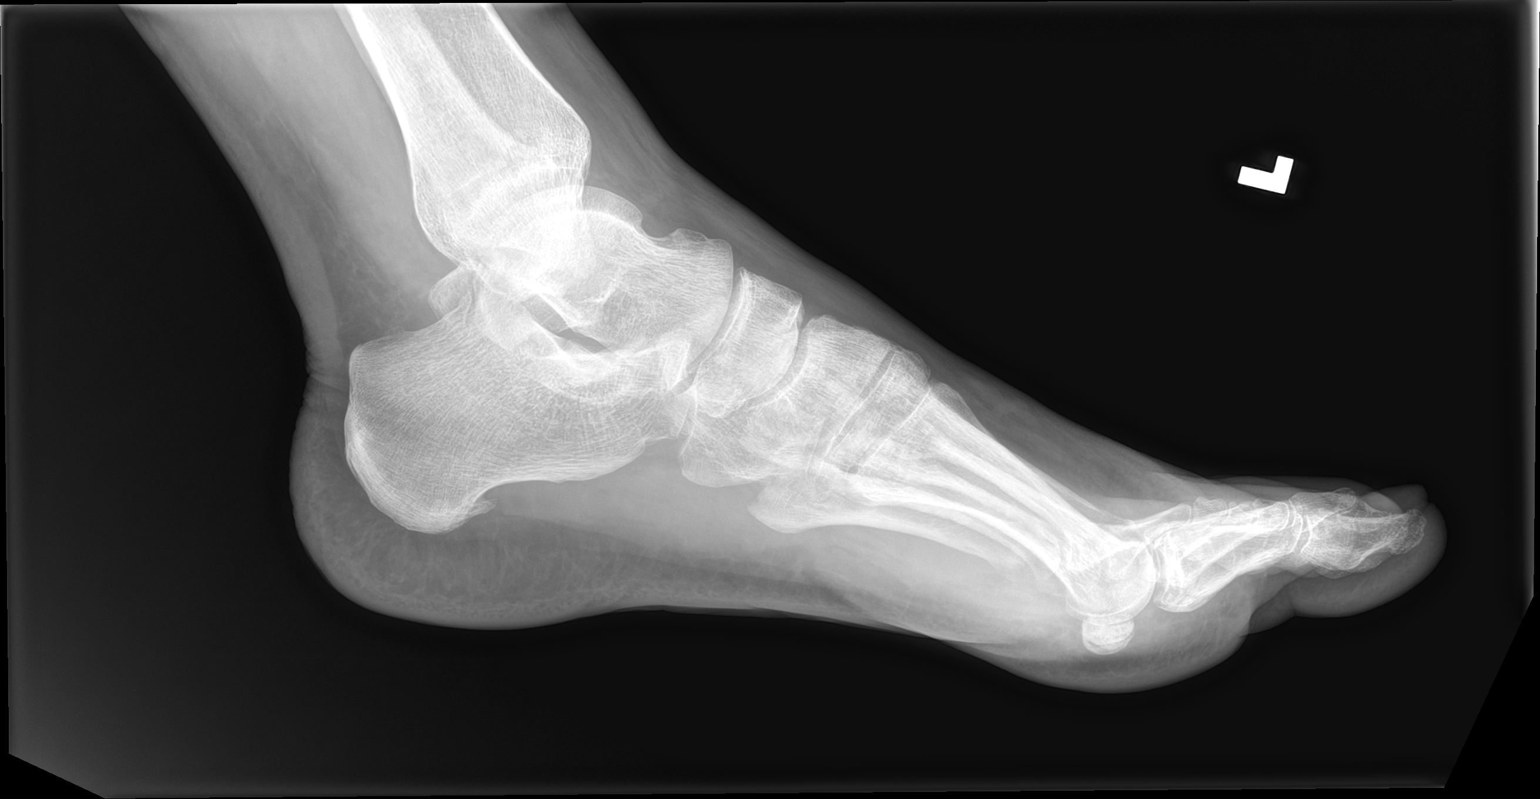

[3 of 3 positions shown; findings below may reference images not displayed]

FINDINGS: There is no evidence of fracture or dislocation. There is no
evidence of arthropathy or other focal bone abnormality. Soft
tissues are unremarkable.
IMPRESSION: Negative.

## 2023-01-14 ENCOUNTER — Encounter: Payer: Self-pay | Admitting: Gastroenterology

## 2023-01-16 NOTE — Progress Notes (Signed)
Primary Care Physician:  Redmond School, MD Primary Gastroenterologist:  Dr. Gala Romney  Chief Complaint  Patient presents with   Colonoscopy    Colonoscopy screening    HPI:   Jonathan Serrano is a 62 y.o. male presenting today to discuss scheduling 10 year screening colonoscopy.   Last colonoscopy 02/03/2013 with normal exam.  Recommended 10-year repeat.  Today he states he is doing well.  No GI concerns.  Denies abdominal pain, unintentional weight loss, BRBPR, melena, constipation, diarrhea, reflux, dysphagia, nausea, vomiting.  Father with polyps around age 49.  History reviewed. No pertinent past medical history.   Past Surgical History:  Procedure Laterality Date   APPENDECTOMY     COLONOSCOPY N/A 02/03/2013   Surgeon: Daneil Dolin, MD; Normal exam. Repeat in 10 years.    Current Outpatient Medications  Medication Sig Dispense Refill   aspirin 81 MG tablet Take 81 mg by mouth daily.     No current facility-administered medications for this visit.    Allergies as of 01/17/2023 - Review Complete 01/17/2023  Allergen Reaction Noted   Penicillins Hives 01/19/2013    Family History  Problem Relation Age of Onset   Colonic polyp Father        early 80s    Social History   Socioeconomic History   Marital status: Married    Spouse name: Not on file   Number of children: Not on file   Years of education: Not on file   Highest education level: Not on file  Occupational History   Not on file  Tobacco Use   Smoking status: Never   Smokeless tobacco: Not on file  Substance and Sexual Activity   Alcohol use: Yes    Comment: occasional- about 4 a week   Drug use: No   Sexual activity: Not on file  Other Topics Concern   Not on file  Social History Narrative   Not on file   Social Determinants of Health   Financial Resource Strain: Not on file  Food Insecurity: Not on file  Transportation Needs: Not on file  Physical Activity: Not on file  Stress: Not  on file  Social Connections: Not on file  Intimate Partner Violence: Not on file    Review of Systems: Gen: Denies any fever, chills, cold or flulike symptoms, presyncope, syncope. CV: Denies chest pain, heart palpitations. Resp: Denies shortness of breath, cough. GI: See HPI GU : Denies urinary burning, urinary frequency, urinary hesitancy MS: Denies joint pain. Derm: Denies rash. Psych: Denies depression, anxiety. Heme: See HPI  Physical Exam: BP 131/78 (BP Location: Right Arm, Patient Position: Sitting, Cuff Size: Normal)   Pulse 75   Temp (!) 97.4 F (36.3 C) (Temporal)   Ht 5' 10"$  (1.778 m)   Wt 195 lb 6.4 oz (88.6 kg)   SpO2 97%   BMI 28.04 kg/m  General:   Alert and oriented. Pleasant and cooperative. Well-nourished and well-developed.  Head:  Normocephalic and atraumatic. Eyes:  Without icterus, sclera clear and conjunctiva pink.  Ears:  Normal auditory acuity. Lungs:  Clear to auscultation bilaterally. No wheezes, rales, or rhonchi. No distress.  Heart:  S1, S2 present without murmurs appreciated.  Abdomen:  +BS, soft, non-tender and non-distended. No HSM noted. No guarding or rebound. No masses appreciated.  Rectal:  Deferred  Msk:  Symmetrical without gross deformities. Normal posture. Extremities:  Without edema. Neurologic:  Alert and  oriented x4;  grossly normal neurologically. Skin:  Intact without significant  lesions or rashes. Psych:  Normal mood and affect.     Assessment:  62 year old male with no significant past medical history, presenting today to discuss scheduling 10-year screening colonoscopy. Last colonoscopy in February 2014 with normal exam.  Office visit was initially recommended due to alcohol, but after further discussion today, he does not drink alcohol in excess.   He is doing well at this time with no significant GI symptoms.  No alarm symptoms.  No family history of colon cancer.  Father with history of colon polyps.   Plan:  Proceed  with colonoscopy with propofol by Dr. Gala Romney in near future. The risks, benefits, and alternatives have been discussed with the patient in detail. The patient states understanding and desires to proceed.  ASA 2 Follow-up PRN   Aliene Altes, Hershal Coria [facility] 01/17/2023

## 2023-01-16 NOTE — H&P (View-Only) (Signed)
Primary Care Physician:  Redmond School, MD Primary Gastroenterologist:  Dr. Gala Romney  Chief Complaint  Patient presents with   Colonoscopy    Colonoscopy screening    HPI:   Jonathan Serrano is a 62 y.o. male presenting today to discuss scheduling 10 year screening colonoscopy.   Last colonoscopy 02/03/2013 with normal exam.  Recommended 10-year repeat.  Today he states he is doing well.  No GI concerns.  Denies abdominal pain, unintentional weight loss, BRBPR, melena, constipation, diarrhea, reflux, dysphagia, nausea, vomiting.  Father with polyps around age 62.  History reviewed. No pertinent past medical history.   Past Surgical History:  Procedure Laterality Date   APPENDECTOMY     COLONOSCOPY N/A 02/03/2013   Surgeon: Daneil Dolin, MD; Normal exam. Repeat in 10 years.    Current Outpatient Medications  Medication Sig Dispense Refill   aspirin 81 MG tablet Take 81 mg by mouth daily.     No current facility-administered medications for this visit.    Allergies as of 01/17/2023 - Review Complete 01/17/2023  Allergen Reaction Noted   Penicillins Hives 01/19/2013    Family History  Problem Relation Age of Onset   Colonic polyp Father        early 21s    Social History   Socioeconomic History   Marital status: Married    Spouse name: Not on file   Number of children: Not on file   Years of education: Not on file   Highest education level: Not on file  Occupational History   Not on file  Tobacco Use   Smoking status: Never   Smokeless tobacco: Not on file  Substance and Sexual Activity   Alcohol use: Yes    Comment: occasional- about 4 a week   Drug use: No   Sexual activity: Not on file  Other Topics Concern   Not on file  Social History Narrative   Not on file   Social Determinants of Health   Financial Resource Strain: Not on file  Food Insecurity: Not on file  Transportation Needs: Not on file  Physical Activity: Not on file  Stress: Not  on file  Social Connections: Not on file  Intimate Partner Violence: Not on file    Review of Systems: Gen: Denies any fever, chills, cold or flulike symptoms, presyncope, syncope. CV: Denies chest pain, heart palpitations. Resp: Denies shortness of breath, cough. GI: See HPI GU : Denies urinary burning, urinary frequency, urinary hesitancy MS: Denies joint pain. Derm: Denies rash. Psych: Denies depression, anxiety. Heme: See HPI  Physical Exam: BP 131/78 (BP Location: Right Arm, Patient Position: Sitting, Cuff Size: Normal)   Pulse 75   Temp (!) 97.4 F (36.3 C) (Temporal)   Ht 5' 10"$  (1.778 m)   Wt 195 lb 6.4 oz (88.6 kg)   SpO2 97%   BMI 28.04 kg/m  General:   Alert and oriented. Pleasant and cooperative. Well-nourished and well-developed.  Head:  Normocephalic and atraumatic. Eyes:  Without icterus, sclera clear and conjunctiva pink.  Ears:  Normal auditory acuity. Lungs:  Clear to auscultation bilaterally. No wheezes, rales, or rhonchi. No distress.  Heart:  S1, S2 present without murmurs appreciated.  Abdomen:  +BS, soft, non-tender and non-distended. No HSM noted. No guarding or rebound. No masses appreciated.  Rectal:  Deferred  Msk:  Symmetrical without gross deformities. Normal posture. Extremities:  Without edema. Neurologic:  Alert and  oriented x4;  grossly normal neurologically. Skin:  Intact without significant  lesions or rashes. Psych:  Normal mood and affect.     Assessment:  62 year old male with no significant past medical history, presenting today to discuss scheduling 10-year screening colonoscopy. Last colonoscopy in February 2014 with normal exam.  Office visit was initially recommended due to alcohol, but after further discussion today, he does not drink alcohol in excess.   He is doing well at this time with no significant GI symptoms.  No alarm symptoms.  No family history of colon cancer.  Father with history of colon polyps.   Plan:  Proceed  with colonoscopy with propofol by Dr. Gala Romney in near future. The risks, benefits, and alternatives have been discussed with the patient in detail. The patient states understanding and desires to proceed.  ASA 2 Follow-up PRN   Aliene Altes, Hershal Coria [facility] 01/17/2023

## 2023-01-17 ENCOUNTER — Ambulatory Visit: Payer: No Typology Code available for payment source | Admitting: Gastroenterology

## 2023-01-17 ENCOUNTER — Other Ambulatory Visit: Payer: Self-pay | Admitting: *Deleted

## 2023-01-17 ENCOUNTER — Encounter: Payer: Self-pay | Admitting: Gastroenterology

## 2023-01-17 ENCOUNTER — Encounter: Payer: Self-pay | Admitting: *Deleted

## 2023-01-17 VITALS — BP 131/78 | HR 75 | Temp 97.4°F | Ht 70.0 in | Wt 195.4 lb

## 2023-01-17 DIAGNOSIS — Z1211 Encounter for screening for malignant neoplasm of colon: Secondary | ICD-10-CM | POA: Insufficient documentation

## 2023-01-17 MED ORDER — PEG 3350-KCL-NA BICARB-NACL 420 G PO SOLR: 4000.0000 mL | Freq: Once | ORAL | 0 refills | Status: AC

## 2023-01-17 NOTE — Patient Instructions (Signed)
We will arrange to have a colonoscopy in the near future with Dr. Gala Romney.  We we will follow-up with you as needed.  Do not hesitate to call if you have any new GI concerns.  It was very nice to meet you today!  Aliene Altes, PA-C [facility]

## 2023-01-20 ENCOUNTER — Encounter (HOSPITAL_COMMUNITY)
Admission: RE | Admit: 2023-01-20 | Discharge: 2023-01-20 | Disposition: A | Discharge status: Home / Self Care | Payer: No Typology Code available for payment source | Source: Ambulatory Visit | Attending: Internal Medicine | Admitting: Internal Medicine

## 2023-01-20 ENCOUNTER — Encounter (HOSPITAL_COMMUNITY): Payer: Self-pay

## 2023-01-20 NOTE — Patient Instructions (Signed)
   Your procedure is scheduled on:   Report to [place] Entrance at     AM.  Call this number if you have problems the morning of surgery: [phone]   Remember:              Follow Directions on the letter you received from Your Physician's office regarding the Bowel Prep              No Smoking the day of Procedure :   Take these medicines the morning of surgery with A SIP OF WATER:    Do not wear jewelry, make-up or nail polish.    Do not bring valuables to the hospital.  Contacts, dentures or bridgework may not be worn into surgery.  .   Patients discharged the day of surgery will not be allowed to drive home.     Colonoscopy, Adult, Care After This sheet gives you information about how to care for yourself after your procedure. Your health care provider may also give you more specific instructions. If you have problems or questions, contact your health care provider. What can I expect after the procedure? After the procedure, it is common to have: A small amount of blood in your stool for 24 hours after the procedure. Some gas. Mild abdominal cramping or bloating.  Follow these instructions at home: General instructions  For the first 24 hours after the procedure: Do not drive or use machinery. Do not sign important documents. Do not drink alcohol. Do your regular daily activities at a slower pace than normal. Eat soft, easy-to-digest foods. Rest often. Take over-the-counter or prescription medicines only as told by your health care provider. It is up to you to get the results of your procedure. Ask your health care provider, or the department performing the procedure, when your results will be ready. Relieving cramping and bloating Try walking around when you have cramps or feel bloated. Apply heat to your abdomen as told by your health care provider. Use a heat source that your health care provider recommends, such as a moist heat pack or a heating  pad. Place a towel between your skin and the heat source. Leave the heat on for 20-30 minutes. Remove the heat if your skin turns bright red. This is especially important if you are unable to feel pain, heat, or cold. You may have a greater risk of getting burned. Eating and drinking Drink enough fluid to keep your urine clear or pale yellow. Resume your normal diet as instructed by your health care provider. Avoid heavy or fried foods that are hard to digest. Avoid drinking alcohol for as long as instructed by your health care provider. Contact a health care provider if: You have blood in your stool 2-3 days after the procedure. Get help right away if: You have more than a small spotting of blood in your stool. You pass large blood clots in your stool. Your abdomen is swollen. You have nausea or vomiting. You have a fever. You have increasing abdominal pain that is not relieved with medicine. This information is not intended to replace advice given to you by your health care provider. Make sure you discuss any questions you have with your health care provider. Document Released: 07/09/2004 Document Revised: 08/19/2016 Document Reviewed: 02/06/2016 Elsevier Interactive Patient Education  [employer].

## 2023-01-23 ENCOUNTER — Ambulatory Visit (HOSPITAL_COMMUNITY): Payer: No Typology Code available for payment source | Admitting: Certified Registered"

## 2023-01-23 ENCOUNTER — Encounter (HOSPITAL_COMMUNITY): Payer: Self-pay | Admitting: Internal Medicine

## 2023-01-23 ENCOUNTER — Ambulatory Visit (HOSPITAL_COMMUNITY)
Admission: RE | Admit: 2023-01-23 | Discharge: 2023-01-23 | Disposition: A | Discharge status: Home / Self Care | Payer: No Typology Code available for payment source | Source: Ambulatory Visit | Attending: Internal Medicine | Admitting: Internal Medicine

## 2023-01-23 ENCOUNTER — Encounter (HOSPITAL_COMMUNITY)
Admission: RE | Disposition: A | Discharge status: Home / Self Care | Payer: Self-pay | Source: Ambulatory Visit | Attending: Internal Medicine

## 2023-01-23 DIAGNOSIS — Z83719 Family history of colon polyps, unspecified: Secondary | ICD-10-CM | POA: Diagnosis present

## 2023-01-23 DIAGNOSIS — Z1211 Encounter for screening for malignant neoplasm of colon: Secondary | ICD-10-CM | POA: Diagnosis not present

## 2023-01-23 HISTORY — PX: COLONOSCOPY WITH PROPOFOL: SHX5780

## 2023-01-23 SURGERY — COLONOSCOPY WITH PROPOFOL
Anesthesia: General

## 2023-01-23 MED ORDER — LACTATED RINGERS IV SOLN: INTRAVENOUS | Status: DC

## 2023-01-23 MED ORDER — PROPOFOL 10 MG/ML IV BOLUS
INTRAVENOUS | Status: DC | PRN
  Administered 2023-01-23: 100 mg via INTRAVENOUS

## 2023-01-23 MED ORDER — PROPOFOL 500 MG/50ML IV EMUL
INTRAVENOUS | Status: DC | PRN
  Administered 2023-01-23: 150 ug/kg/min via INTRAVENOUS

## 2023-01-23 MED ORDER — LIDOCAINE HCL (CARDIAC) PF 100 MG/5ML IV SOSY
PREFILLED_SYRINGE | INTRAVENOUS | Status: DC | PRN
  Administered 2023-01-23: 50 mg via INTRAVENOUS

## 2023-01-23 MED ORDER — STERILE WATER FOR IRRIGATION IR SOLN
Status: DC | PRN
  Administered 2023-01-23: 100 mL

## 2023-01-23 MED ORDER — PHENYLEPHRINE 80 MCG/ML (10ML) SYRINGE FOR IV PUSH (FOR BLOOD PRESSURE SUPPORT)
PREFILLED_SYRINGE | INTRAVENOUS | Status: DC | PRN
  Administered 2023-01-23 (×3): 80 ug via INTRAVENOUS

## 2023-01-23 NOTE — Anesthesia Preprocedure Evaluation (Signed)
Anesthesia Evaluation  Patient identified by MRN, date of birth, ID band Patient awake    Reviewed: Allergy & Precautions, H&P , NPO status , Patient's Chart, lab work & pertinent test results, reviewed documented beta blocker date and time   Airway Mallampati: II  TM Distance: >3 FB Neck ROM: full    Dental no notable dental hx.    Pulmonary neg pulmonary ROS   Pulmonary exam normal breath sounds clear to auscultation       Cardiovascular Exercise Tolerance: Good negative cardio ROS  Rhythm:regular Rate:Normal     Neuro/Psych negative neurological ROS  negative psych ROS   GI/Hepatic negative GI ROS, Neg liver ROS,,,  Endo/Other  negative endocrine ROS    Renal/GU negative Renal ROS  negative genitourinary   Musculoskeletal   Abdominal   Peds  Hematology negative hematology ROS (+)   Anesthesia Other Findings   Reproductive/Obstetrics negative OB ROS                             Anesthesia Physical Anesthesia Plan  ASA: 1  Anesthesia Plan: General   Post-op Pain Management:    Induction:   PONV Risk Score and Plan: Propofol infusion  Airway Management Planned:   Additional Equipment:   Intra-op Plan:   Post-operative Plan:   Informed Consent: I have reviewed the patients History and Physical, chart, labs and discussed the procedure including the risks, benefits and alternatives for the proposed anesthesia with the patient or authorized representative who has indicated his/her understanding and acceptance.     Dental Advisory Given  Plan Discussed with: CRNA  Anesthesia Plan Comments:        Anesthesia Quick Evaluation

## 2023-01-23 NOTE — Discharge Instructions (Signed)
  Colonoscopy Discharge Instructions  Read the instructions outlined below and refer to this sheet in the next few weeks. These discharge instructions provide you with general information on caring for yourself after you leave the hospital. Your doctor may also give you specific instructions. While your treatment has been planned according to the most current medical practices available, unavoidable complications occasionally occur. If you have any problems or questions after discharge, call Dr. Gala Romney at [phone]. ACTIVITY You may resume your regular activity, but move at a slower pace for the next 24 hours.  Take frequent rest periods for the next 24 hours.  Walking will help get rid of the air and reduce the bloated feeling in your belly (abdomen).  No driving for 24 hours (because of the medicine (anesthesia) used during the test).   Do not sign any important legal documents or operate any machinery for 24 hours (because of the anesthesia used during the test).  NUTRITION Drink plenty of fluids.  You may resume your normal diet as instructed by your doctor.  Begin with a light meal and progress to your normal diet. Heavy or fried foods are harder to digest and may make you feel sick to your stomach (nauseated).  Avoid alcoholic beverages for 24 hours or as instructed.  MEDICATIONS You may resume your normal medications unless your doctor tells you otherwise.  WHAT YOU CAN EXPECT TODAY Some feelings of bloating in the abdomen.  Passage of more gas than usual.  Spotting of blood in your stool or on the toilet paper.  IF YOU HAD POLYPS REMOVED DURING THE COLONOSCOPY: No aspirin products for 7 days or as instructed.  No alcohol for 7 days or as instructed.  Eat a soft diet for the next 24 hours.  FINDING OUT THE RESULTS OF YOUR TEST Not all test results are available during your visit. If your test results are not back during the visit, make an appointment with your caregiver to find out the  results. Do not assume everything is normal if you have not heard from your caregiver or the medical facility. It is important for you to follow up on all of your test results.  SEEK IMMEDIATE MEDICAL ATTENTION IF: You have more than a spotting of blood in your stool.  Your belly is swollen (abdominal distention).  You are nauseated or vomiting.  You have a temperature over 101.  You have abdominal pain or discomfort that is severe or gets worse throughout the day.     Your colon appeared entirely normal today.  Given your family history of colon polyps it is recommended you return in 7 years for repeat examination        at patient request, I called Jonathan Serrano at [phone] -  discussed findings and recommendations

## 2023-01-23 NOTE — Op Note (Signed)
[facility] Patient Name: Jonathan Serrano Procedure Date: 01/23/2023 2:28 PM MRN: [id number] Date of Birth: 05-04-1961 Attending MD: Norvel Richards , MD, [id number] CSN: [id number] Age: 62 Admit Type: Outpatient Procedure:                Colonoscopy Indications:              Screening for colorectal malignant neoplasm Providers:                Norvel Richards, MD, Caprice Kluver, Raphael Gibney, Technician Referring MD:              Medicines:                Propofol per Anesthesia Complications:            No immediate complications. Estimated Blood Loss:     Estimated blood loss: none. Procedure:                Pre-Anesthesia Assessment:                           - Prior to the procedure, a History and Physical                            was performed, and patient medications and                            allergies were reviewed. The patient's tolerance of                            previous anesthesia was also reviewed. The risks                            and benefits of the procedure and the sedation                            options and risks were discussed with the patient.                            All questions were answered, and informed consent                            was obtained. Prior Anticoagulants: The patient has                            taken no anticoagulant or antiplatelet agents. ASA                            Grade Assessment: II - A patient with mild systemic                            disease. After reviewing the risks and benefits,  the patient was deemed in satisfactory condition to                            undergo the procedure.                           After obtaining informed consent, the colonoscope                            was passed under direct vision. Throughout the                            procedure, the patient's blood pressure, pulse, and                            oxygen  saturations were monitored continuously. The                            [id number]) scope was introduced through the                            anus and advanced to the 5 cm into the ileum. The                            colonoscopy was performed without difficulty. The                            patient tolerated the procedure well. The quality                            of the bowel preparation was adequate. The terminal                            ileum, ileocecal valve, appendiceal orifice, and                            rectum were photographed. The colonoscopy was                            performed without difficulty. The patient tolerated                            the procedure well. The entire colon was well                            visualized. Scope In: 2:39:40 PM Scope Out: 2:52:25 PM Scope Withdrawal Time: 0 hours 8 minutes 35 seconds  Total Procedure Duration: 0 hours 12 minutes 45 seconds  Findings:      The perianal and digital rectal examinations were normal. Distal 5 cm of       TI appeared normal. Patient had an appendiceal stump in the base the       cecum consistent with prior appendectomy.      The colon (entire examined portion) appeared normal.      The  retroflexed view of the distal rectum and anal verge was normal and       showed no anal or rectal abnormalities. Impression:               - The entire examined colon is normal. Distal TI                            appeared normal.                           - The distal rectum and anal verge are normal on                            retroflexion view.                           - No specimens collected. Moderate Sedation:      Moderate (conscious) sedation was personally administered by an       anesthesia professional. The following parameters were monitored: oxygen       saturation, heart rate, blood pressure, respiratory rate, EKG, adequacy       of pulmonary ventilation, and response to  care. Recommendation:           - Patient has a contact number available for                            emergencies. The signs and symptoms of potential                            delayed complications were discussed with the                            patient. Return to normal activities tomorrow.                            Written discharge instructions were provided to the                            patient.                           - Advance diet as tolerated.                           - Continue present medications.                           - Repeat colonoscopy in 7 years for screening                            purposes. Given family history.                           - Return to GI office PRN. Procedure Code(s):        --- Professional ---  45378, Colonoscopy, flexible; diagnostic, including                            collection of specimen(s) by brushing or washing,                            when performed (separate procedure) Diagnosis Code(s):        --- Professional ---                           Z12.11, Encounter for screening for malignant                            neoplasm of colon CPT copyright 2022 American Medical Association. All rights reserved. The codes documented in this report are preliminary and upon coder review may  be revised to meet current compliance requirements. Cristopher Estimable. Paco Cislo, MD Norvel Richards, MD 01/23/2023 3:38:37 PM This report has been signed electronically. Number of Addenda: 0

## 2023-01-23 NOTE — Interval H&P Note (Signed)
History and Physical Interval Note:  01/23/2023 2:29 PM  Jonathan Serrano  has presented today for surgery, with the diagnosis of screening.  The various methods of treatment have been discussed with the patient and family. After consideration of risks, benefits and other options for treatment, the patient has consented to  Procedure(s) with comments: COLONOSCOPY WITH PROPOFOL (N/A) - 2:30 am ASA 2 as a surgical intervention.  The patient's history has been reviewed, patient examined, no change in status, stable for surgery.  I have reviewed the patient's chart and labs.  Questions were answered to the patient's satisfaction.     [name]     Patient here for 10-year screening colonoscopy. The risks, benefits, limitations, alternatives and imponderables have been reviewed with the patient. Questions have been answered. All parties are agreeable.

## 2023-01-23 NOTE — Transfer of Care (Signed)
Immediate Anesthesia Transfer of Care Note  Patient: Jonathan Serrano  Procedure(s) Performed: COLONOSCOPY WITH PROPOFOL  Patient Location: Endoscopy Unit  Anesthesia Type:General  Level of Consciousness: awake  Airway & Oxygen Therapy: Patient Spontanous Breathing  Post-op Assessment: Report given to RN and Post -op Vital signs reviewed and stable  Post vital signs: Reviewed and stable  Last Vitals:  Vitals Value Taken Time  BP 94/57 01/23/23 1454  Temp 36.7 C 01/23/23 1454  Pulse 70 01/23/23 1454  Resp 18 01/23/23 1454  SpO2 96 % 01/23/23 1454    Last Pain:  Vitals:   01/23/23 1454  TempSrc: Oral  PainSc: 0-No pain      Patients Stated Pain Goal: 5 ([id number])  Complications: No notable events documented.

## 2023-01-25 NOTE — Anesthesia Postprocedure Evaluation (Signed)
Anesthesia Post Note  Patient: Jonathan Serrano  Procedure(s) Performed: COLONOSCOPY WITH PROPOFOL  Patient location during evaluation: Phase II Anesthesia Type: General Level of consciousness: awake Pain management: pain level controlled Vital Signs Assessment: post-procedure vital signs reviewed and stable Respiratory status: spontaneous breathing and respiratory function stable Cardiovascular status: blood pressure returned to baseline and stable Postop Assessment: no headache and no apparent nausea or vomiting Anesthetic complications: no Comments: Late entry   No notable events documented.   Last Vitals:  Vitals:   01/23/23 1454 01/23/23 1459  BP: (!) 94/57 99/64  Pulse: 70   Resp: 18   Temp: 36.7 C   SpO2: 96%     Last Pain:  Vitals:   01/23/23 1454  TempSrc: Oral  PainSc: 0-No pain                 Louann Sjogren

## 2023-01-29 ENCOUNTER — Encounter (HOSPITAL_COMMUNITY): Payer: Self-pay | Admitting: Internal Medicine

## 2023-02-06 ENCOUNTER — Encounter: Payer: Self-pay | Admitting: Radiology

## 2023-02-12 ENCOUNTER — Ambulatory Visit: Payer: No Typology Code available for payment source | Admitting: Gastroenterology

## 2023-03-19 ENCOUNTER — Telehealth: Payer: Self-pay

## 2023-03-19 NOTE — Telephone Encounter (Signed)
Patient needing cholesterol done with labs on April 15?  Please let patient know if this can be done.

## 2023-03-19 NOTE — Telephone Encounter (Signed)
Patient aware that PCP will have to check cholesterol labs, Dr. Ronne Binning does not treat.  Patient has already had labs drawn and pcp is managing his cholesterol, he was just wanting to get updated labs.  Will f/u with MD

## 2023-03-24 ENCOUNTER — Other Ambulatory Visit: Payer: No Typology Code available for payment source

## 2023-03-25 LAB — PSA, TOTAL AND FREE
PSA, Free Pct: 14.6 %
PSA, Free: 0.86 ng/mL
Prostate Specific Ag, Serum: 5.9 ng/mL — ABNORMAL HIGH (ref 0.0–4.0)

## 2023-03-31 ENCOUNTER — Ambulatory Visit: Payer: No Typology Code available for payment source | Admitting: Urology

## 2023-03-31 ENCOUNTER — Encounter: Payer: Self-pay | Admitting: Urology

## 2023-03-31 VITALS — BP 138/55 | HR 80

## 2023-03-31 DIAGNOSIS — R972 Elevated prostate specific antigen [PSA]: Secondary | ICD-10-CM | POA: Diagnosis not present

## 2023-03-31 DIAGNOSIS — R351 Nocturia: Secondary | ICD-10-CM

## 2023-03-31 LAB — URINALYSIS, ROUTINE W REFLEX MICROSCOPIC
Bilirubin, UA: NEGATIVE
Glucose, UA: NEGATIVE
Ketones, UA: NEGATIVE
Leukocytes,UA: NEGATIVE
Nitrite, UA: NEGATIVE
Specific Gravity, UA: 1.03 (ref 1.005–1.030)
Urobilinogen, Ur: 0.2 mg/dL (ref 0.2–1.0)
pH, UA: 5 (ref 5.0–7.5)

## 2023-03-31 LAB — MICROSCOPIC EXAMINATION: Bacteria, UA: NONE SEEN

## 2023-03-31 NOTE — Progress Notes (Unsigned)
03/31/2023 11:28 AM   [name] [birth year] [id number]  Referring provider: Elfredia Nevins, MD [address] [place],  [place] [address]   Followup elevated PSA  HPI: Jonathan Serrano is a 61yo here for followup for elevated PSA. PSA increased to 5.9 from 5.4. He took a decongestant the 2-3 days before the PSA was drain. IPSS 0 QOL 0. Urine stream strong   PMH: No past medical history on file.  Surgical History: Past Surgical History:  Procedure Laterality Date   APPENDECTOMY     COLONOSCOPY N/A 02/03/2013   Surgeon: Corbin Ade, MD; Normal exam. Repeat in 10 years.   COLONOSCOPY WITH PROPOFOL N/A 01/23/2023   Procedure: COLONOSCOPY WITH PROPOFOL;  Surgeon: Corbin Ade, MD;  [facility];  Service: Endoscopy;  Laterality: N/A;  2:30 am ASA 2    Home Medications:  Allergies as of 03/31/2023       Reactions   Penicillins Hives        Medication List        Accurate as of March 31, 2023 11:28 AM. If you have any questions, ask your nurse or doctor.          aspirin 81 MG tablet Take 81 mg by mouth daily.   VITAMIN D PO Take 1 capsule by mouth daily.        Allergies:  Allergies  Allergen Reactions   Penicillins Hives    Family History: Family History  Problem Relation Age of Onset   Colonic polyp Father        early 68s    Social History:  reports that he has never smoked. He does not have any smokeless tobacco history on file. He reports current alcohol use. He reports that he does not use drugs.  ROS: All other review of systems were reviewed and are negative except what is noted above in HPI  Physical Exam: BP (!) 138/55   Pulse 80   Constitutional:  Alert and oriented, No acute distress. HEENT: [place] AT, moist mucus membranes.  Trachea midline, no masses. Cardiovascular: No clubbing, cyanosis, or edema. Respiratory: Normal respiratory effort, no increased work of breathing. GI: Abdomen is soft, nontender,  nondistended, no abdominal masses GU: No CVA tenderness.  Lymph: No cervical or inguinal lymphadenopathy. Skin: No rashes, bruises or suspicious lesions. Neurologic: Grossly intact, no focal deficits, moving all 4 extremities. Psychiatric: Normal mood and affect.  Laboratory Data: No results found for: "WBC", "HGB", "HCT", "MCV", "PLT"  No results found for: "CREATININE"  No results found for: "PSA"  No results found for: "TESTOSTERONE"  No results found for: "HGBA1C"  Urinalysis    Component Value Date/Time   APPEARANCEUR Clear 09/27/2022 1213   GLUCOSEU Negative 09/27/2022 1213   BILIRUBINUR Negative 09/27/2022 1213   PROTEINUR Negative 09/27/2022 1213   NITRITE Negative 09/27/2022 1213   LEUKOCYTESUR Negative 09/27/2022 1213    Lab Results  Component Value Date   LABMICR Comment 09/27/2022   WBCUA 0-5 09/12/2021   LABEPIT 0-10 09/12/2021   BACTERIA None seen 09/12/2021    Pertinent Imaging:  No results found for this or any previous visit.  No results found for this or any previous visit.  No results found for this or any previous visit.  No results found for this or any previous visit.  No results found for this or any previous visit.  No valid procedures specified. No results found for this or any previous visit.  No results found for this  or any previous visit.   Assessment & Plan:    1. Elevated PSA -Continue surveillance. Followup 6 months with PSA - Urinalysis, Routine w reflex microscopic  2. Nocturia -resolved   No follow-ups on file.  Wilkie Aye, MD  [facility] [place]

## 2023-03-31 NOTE — Patient Instructions (Signed)

## 2023-09-15 ENCOUNTER — Other Ambulatory Visit: Payer: No Typology Code available for payment source

## 2023-09-16 ENCOUNTER — Other Ambulatory Visit: Payer: No Typology Code available for payment source

## 2023-09-16 DIAGNOSIS — R972 Elevated prostate specific antigen [PSA]: Secondary | ICD-10-CM

## 2023-09-17 LAB — PSA, TOTAL AND FREE
PSA, Free Pct: 14 %
PSA, Free: 0.84 ng/mL
Prostate Specific Ag, Serum: 6 ng/mL — ABNORMAL HIGH (ref 0.0–4.0)

## 2023-09-22 ENCOUNTER — Ambulatory Visit: Payer: No Typology Code available for payment source | Admitting: Urology

## 2023-09-22 ENCOUNTER — Encounter: Payer: Self-pay | Admitting: Urology

## 2023-09-22 VITALS — BP 125/62 | HR 64

## 2023-09-22 DIAGNOSIS — R972 Elevated prostate specific antigen [PSA]: Secondary | ICD-10-CM

## 2023-09-22 LAB — URINALYSIS, ROUTINE W REFLEX MICROSCOPIC
Bilirubin, UA: NEGATIVE
Glucose, UA: NEGATIVE
Ketones, UA: NEGATIVE
Nitrite, UA: NEGATIVE
Protein,UA: NEGATIVE
RBC, UA: NEGATIVE
Specific Gravity, UA: 1.01 (ref 1.005–1.030)
Urobilinogen, Ur: 0.2 mg/dL (ref 0.2–1.0)
pH, UA: 6.5 (ref 5.0–7.5)

## 2023-09-22 LAB — MICROSCOPIC EXAMINATION
Bacteria, UA: NONE SEEN
RBC, Urine: NONE SEEN /[HPF] (ref 0–2)

## 2023-09-22 NOTE — Progress Notes (Signed)
09/22/2023 9:30 AM   [name] January 01, 1961 [id number]  Referring provider: Elfredia Nevins, MD [address] [place],  [place] [address]  Followup elevated PSA   HPI: Mr Jonathan Serrano is a 62yo here for followup for elevated PSA. PSA stable at 6.0. His PSA 6 months ago was 5.9. IPSS 0 QOL 0. Urine stream strong. No straining to urinate. Nocturia 0x. No straining to urinate.    PMH: History reviewed. No pertinent past medical history.  Surgical History: Past Surgical History:  Procedure Laterality Date   APPENDECTOMY     COLONOSCOPY N/A 02/03/2013   Surgeon: Corbin Ade, MD; Normal exam. Repeat in 10 years.   COLONOSCOPY WITH PROPOFOL N/A 01/23/2023   Procedure: COLONOSCOPY WITH PROPOFOL;  Surgeon: Corbin Ade, MD;  Location: [facility];  Service: Endoscopy;  Laterality: N/A;  2:30 am ASA 2    Home Medications:  Allergies as of 09/22/2023       Reactions   Penicillins Hives        Medication List        Accurate as of September 22, 2023  9:30 AM. If you have any questions, ask your nurse or doctor.          aspirin 81 MG tablet Take 81 mg by mouth daily.   VITAMIN D PO Take 1 capsule by mouth daily.        Allergies:  Allergies  Allergen Reactions   Penicillins Hives    Family History: Family History  Problem Relation Age of Onset   Colonic polyp Father        early 71s    Social History:  reports that he has never smoked. He does not have any smokeless tobacco history on file. He reports current alcohol use. He reports that he does not use drugs.  ROS: All other review of systems were reviewed and are negative except what is noted above in HPI  Physical Exam: BP 125/62   Pulse 64   Constitutional:  Alert and oriented, No acute distress. HEENT: [place] AT, moist mucus membranes.  Trachea midline, no masses. Cardiovascular: No clubbing, cyanosis, or edema. Respiratory: Normal respiratory effort, no increased work of breathing. GI:  Abdomen is soft, nontender, nondistended, no abdominal masses GU: No CVA tenderness.  Lymph: No cervical or inguinal lymphadenopathy. Skin: No rashes, bruises or suspicious lesions. Neurologic: Grossly intact, no focal deficits, moving all 4 extremities. Psychiatric: Normal mood and affect.  Laboratory Data: No results found for: "WBC", "HGB", "HCT", "MCV", "PLT"  No results found for: "CREATININE"  No results found for: "PSA"  No results found for: "TESTOSTERONE"  No results found for: "HGBA1C"  Urinalysis    Component Value Date/Time   APPEARANCEUR Clear 03/31/2023 1115   GLUCOSEU Negative 03/31/2023 1115   BILIRUBINUR Negative 03/31/2023 1115   PROTEINUR 1+ (A) 03/31/2023 1115   NITRITE Negative 03/31/2023 1115   LEUKOCYTESUR Negative 03/31/2023 1115    Lab Results  Component Value Date   LABMICR See below: 03/31/2023   WBCUA 0-5 03/31/2023   LABEPIT 0-10 03/31/2023   MUCUS Present (A) 03/31/2023   BACTERIA None seen 03/31/2023    Pertinent Imaging:  No results found for this or any previous visit.  No results found for this or any previous visit.  No results found for this or any previous visit.  No results found for this or any previous visit.  No results found for this or any previous visit.  No valid procedures specified. No results  found for this or any previous visit.  No results found for this or any previous visit.   Assessment & Plan:    1. Elevated PSA The patient and I talked about etiologies of elevated PSA.  We discussed the possible relationship between elevated PSA and prostate cancer, BPH, prostatitis, infection trauma and recent ejaculations.  The patient's PSA has been relatively stable over the interval and as such I recommended that we follow-up with a repeat PSA in 6 months.  If it remains elevated with a positive rising trend we will discuss prostate biopsy at his follow-up appointment.  - Urinalysis, Routine w reflex  microscopic   Return in about 6 months (around 03/22/2024) for PSA.  Wilkie Aye, MD  [facility] [place]

## 2024-02-29 ENCOUNTER — Emergency Department (HOSPITAL_COMMUNITY)

## 2024-02-29 ENCOUNTER — Other Ambulatory Visit: Payer: Self-pay

## 2024-02-29 ENCOUNTER — Encounter (HOSPITAL_COMMUNITY): Payer: Self-pay

## 2024-02-29 ENCOUNTER — Emergency Department (HOSPITAL_COMMUNITY)
Admission: EM | Admit: 2024-02-29 | Discharge: 2024-02-29 | Disposition: A | Discharge status: Home / Self Care | Attending: Emergency Medicine | Admitting: Emergency Medicine

## 2024-02-29 DIAGNOSIS — S43401A Unspecified sprain of right shoulder joint, initial encounter: Secondary | ICD-10-CM | POA: Diagnosis not present

## 2024-02-29 DIAGNOSIS — S4991XA Unspecified injury of right shoulder and upper arm, initial encounter: Secondary | ICD-10-CM | POA: Diagnosis present

## 2024-02-29 DIAGNOSIS — Z7982 Long term (current) use of aspirin: Secondary | ICD-10-CM | POA: Diagnosis not present

## 2024-02-29 DIAGNOSIS — Y9389 Activity, other specified: Secondary | ICD-10-CM | POA: Diagnosis not present

## 2024-02-29 DIAGNOSIS — X501XXA Overexertion from prolonged static or awkward postures, initial encounter: Secondary | ICD-10-CM | POA: Diagnosis not present

## 2024-02-29 MED ORDER — METHOCARBAMOL 750 MG PO TABS: 750.0000 mg | ORAL_TABLET | Freq: Three times a day (TID) | ORAL | 0 refills | Status: DC | PRN

## 2024-02-29 MED ORDER — IBUPROFEN 400 MG PO TABS
400.0000 mg | ORAL_TABLET | Freq: Once | ORAL | Status: AC
  Administered 2024-02-29: 400 mg via ORAL
  Filled 2024-02-29: qty 1

## 2024-02-29 NOTE — ED Notes (Signed)
 Pt refused Ice to area.

## 2024-02-29 NOTE — ED Triage Notes (Signed)
 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  Pt reports he got nervous when he saw his BP and pulse were low right after the injury occurred.

## 2024-02-29 NOTE — ED Notes (Signed)
 Patient transported to X-ray

## 2024-02-29 NOTE — ED Provider Notes (Signed)
 [employer] EMERGENCY DEPARTMENT AT [facility] Provider Note   CSN: [id number] Arrival date & time: 02/29/24  1327     History {Add pertinent medical, surgical, social history, OB history to HPI:1} Chief Complaint  Patient presents with   Shoulder Injury    Jonathan Serrano is a 63 y.o. male.  Pt with c/o working no motorcycle, was using wrench and pulling on bike (similar movement to a fly-type gym movement), when felt a pull/popping/cracking sensation in medial/inferior aspect shoulder. No neck pain. No headache. No radicular pain. Pain is worse w certain movements and indicates has full rom shoulder, but certain movements of shoulder seem as lacking normal 'power'. No arm or shoulder swelling. No arm or hand numbness or lack of function.   The history is provided by the patient and medical records.  Shoulder Injury Pertinent negatives include no chest pain, no headaches and no shortness of breath.       Home Medications Prior to Admission medications   Medication Sig Start Date End Date Taking? Authorizing Provider  aspirin 81 MG tablet Take 81 mg by mouth daily.    [provider]  VITAMIN D PO Take 1 capsule by mouth daily.    [provider]      Allergies    Penicillins    Review of Systems   Review of Systems  Constitutional:  Negative for fever.  Respiratory:  Negative for shortness of breath.   Cardiovascular:  Negative for chest pain.  Gastrointestinal:  Negative for vomiting.  Musculoskeletal:  Negative for neck pain.       Right shoulder pain  Neurological:  Negative for numbness and headaches.    Physical Exam Updated Vital Signs BP (!) 105/52 (BP Location: Left Arm)   Pulse (!) 58   Temp 98.2 F (36.8 C) (Oral)   Resp 16   Ht 1.778 m (5\' 10" )   Wt 78 kg   SpO2 99%   BMI 24.68 kg/m  Physical Exam Vitals and nursing note reviewed.  Constitutional:      Appearance: Normal appearance. He is well-developed.  HENT:      Head: Atraumatic.  Neck:     Trachea: No tracheal deviation.  Cardiovascular:     Rate and Rhythm: Normal rate.     Pulses: Normal pulses.  Pulmonary:     Effort: Pulmonary effort is normal. No accessory muscle usage or respiratory distress.  Musculoskeletal:        General: No swelling.     Cervical back: Normal range of motion and neck supple. No rigidity or tenderness.     Comments: Pain w arm extended and attempts to adduct arm, and some pain w active rom above shoulder level. No pain w passive rom. No RUE swelling. RUE is of normal color and warmth. Radial pulse 2+.  Skin:    General: Skin is warm and dry.     Findings: No rash.  Neurological:     Mental Status: He is alert.     Comments: Alert, speech clear. Motor/sens grossly intact. Steady gait. RUE nvi.   Psychiatric:        Mood and Affect: Mood normal.     ED Results / Procedures / Treatments   Labs (all labs ordered are listed, but only abnormal results are displayed) Labs Reviewed - No data to display  EKG None  Radiology No results found.  Procedures Procedures  {Document cardiac monitor, telemetry assessment procedure when appropriate:1}  Medications Ordered in  ED Medications - No data to display  ED Course/ Medical Decision Making/ A&P   {   Click here for ABCD2, HEART and other calculatorsREFRESH Note before signing :1}                              Medical Decision Making Problems Addressed: Sprain of right shoulder, unspecified shoulder sprain type, initial encounter: acute illness or injury    Details: Possible rotator cuff injury/strain/tear vs labral tear, vs other pain/strain  Amount and/or Complexity of Data Reviewed External Data Reviewed: notes. Radiology: ordered and independent interpretation performed. Decision-making details documented in ED Course.  Risk Prescription drug management.   Imaging.   Reviewed nursing notes and prior charts for additional history.   No meds pta.  Ibuprofen po.   Xrays reviewed/interpreted by me - no fx.  Suspect possible soft tissue, muscular or ligamentous injury, possible rotator cuff tear/sprain.   Rec ortho f/u.  Return precautions provided.     {Document critical care time when appropriate:1} {Document review of labs and clinical decision tools ie heart score, Chads2Vasc2 etc:1}  {Document your independent review of radiology images, and any outside records:1} {Document your discussion with family members, caretakers, and with consultants:1} {Document social determinants of health affecting pt's care:1} {Document your decision making why or why not admission, treatments were needed:1} Final Clinical Impression(s) / ED Diagnoses Final diagnoses:  None    Rx / DC Orders ED Discharge Orders     None

## 2024-02-29 NOTE — Discharge Instructions (Addendum)
 It was our pleasure to provide your ER care today - we hope that you feel better. Your xray looks good - no fracture of acute process. As we discussed, there may be soft tissue, ligamentous or muscle injury such as rotator cuff tear or tear of labrum - follow up with orthopedist is recommended.   Icepack to sore area. May wear shoulder sling for comfort for the next few days.   Take acetaminophen or ibuprofen as need for pain. You may also try robaxin as need for muscle pain/spasm - no driving when taking.   Follow up with orthopedist in the next 1-2 weeks - call office Monday to arrange follow up appointment.   Return to ER if worse, new symptoms, new/severe pain, weak/fainting, chest pain, trouble breathing, numbness/weakness, or other concern.

## 2024-03-22 ENCOUNTER — Other Ambulatory Visit: Payer: No Typology Code available for payment source

## 2024-03-22 DIAGNOSIS — R972 Elevated prostate specific antigen [PSA]: Secondary | ICD-10-CM

## 2024-03-23 LAB — PSA, TOTAL AND FREE
PSA, Free Pct: 15.7 %
PSA, Free: 0.94 ng/mL
Prostate Specific Ag, Serum: 6 ng/mL — ABNORMAL HIGH (ref 0.0–4.0)

## 2024-03-31 ENCOUNTER — Ambulatory Visit: Payer: No Typology Code available for payment source | Admitting: Urology

## 2024-04-05 ENCOUNTER — Ambulatory Visit: Payer: No Typology Code available for payment source | Admitting: Urology

## 2024-04-05 ENCOUNTER — Encounter: Payer: Self-pay | Admitting: Urology

## 2024-04-05 VITALS — BP 119/64 | HR 70

## 2024-04-05 DIAGNOSIS — R972 Elevated prostate specific antigen [PSA]: Secondary | ICD-10-CM

## 2024-04-05 LAB — MICROSCOPIC EXAMINATION: Bacteria, UA: NONE SEEN

## 2024-04-05 LAB — URINALYSIS, ROUTINE W REFLEX MICROSCOPIC
Bilirubin, UA: NEGATIVE
Glucose, UA: NEGATIVE
Ketones, UA: NEGATIVE
Leukocytes,UA: NEGATIVE
Nitrite, UA: NEGATIVE
Protein,UA: NEGATIVE
Specific Gravity, UA: 1.025 (ref 1.005–1.030)
Urobilinogen, Ur: 0.2 mg/dL (ref 0.2–1.0)
pH, UA: 6 (ref 5.0–7.5)

## 2024-04-05 NOTE — Patient Instructions (Signed)

## 2024-04-05 NOTE — Progress Notes (Signed)
 04/05/2024 9:07 AM   Barnetta Booze 03/12/1961 [id number]  Referring provider: Kathyleen Parkins, MD [address] [place],  [place] [address]  Followup elevated PSA   HPI: Mr Sthilaire is a 63yo here for followup for elevated PSA. PSA stable at 6.0. IPSS 0 QOl 0 on no BPH therapy. Urine stream strong. No straining. No other complaints today   PMH: No past medical history on file.  Surgical History: Past Surgical History:  Procedure Laterality Date   APPENDECTOMY     COLONOSCOPY N/A 02/03/2013   Surgeon: Suzette Espy, MD; Normal exam. Repeat in 10 years.   COLONOSCOPY WITH PROPOFOL  N/A 01/23/2023   Procedure: COLONOSCOPY WITH PROPOFOL ;  Surgeon: Suzette Espy, MD;  Location: [facility];  Service: Endoscopy;  Laterality: N/A;  2:30 am ASA 2    Home Medications:  Allergies as of 04/05/2024       Reactions   Penicillins Hives        Medication List        Accurate as of April 05, 2024  9:07 AM. If you have any questions, ask your nurse or doctor.          STOP taking these medications    methocarbamol  750 MG tablet Commonly known as: ROBAXIN        TAKE these medications    aspirin 81 MG tablet Take 81 mg by mouth daily.   VITAMIN D PO Take 1 capsule by mouth daily.        Allergies:  Allergies  Allergen Reactions   Penicillins Hives    Family History: Family History  Problem Relation Age of Onset   Colonic polyp Father        early 60s    Social History:  reports that he has never smoked. He does not have any smokeless tobacco history on file. He reports that he does not currently use alcohol. He reports that he does not use drugs.  ROS: All other review of systems were reviewed and are negative except what is noted above in HPI  Physical Exam: BP 119/64   Pulse 70   Constitutional:  Alert and oriented, No acute distress. HEENT: [place] AT, moist mucus membranes.  Trachea midline, no masses. Cardiovascular: No clubbing, cyanosis,  or edema. Respiratory: Normal respiratory effort, no increased work of breathing. GI: Abdomen is soft, nontender, nondistended, no abdominal masses GU: No CVA tenderness.  Lymph: No cervical or inguinal lymphadenopathy. Skin: No rashes, bruises or suspicious lesions. Neurologic: Grossly intact, no focal deficits, moving all 4 extremities. Psychiatric: Normal mood and affect.  Laboratory Data: No results found for: "WBC", "HGB", "HCT", "MCV", "PLT"  No results found for: "CREATININE"  No results found for: "PSA"  No results found for: "TESTOSTERONE"  No results found for: "HGBA1C"  Urinalysis    Component Value Date/Time   APPEARANCEUR Clear 09/22/2023 0913   GLUCOSEU Negative 09/22/2023 0913   BILIRUBINUR Negative 09/22/2023 0913   PROTEINUR Negative 09/22/2023 0913   NITRITE Negative 09/22/2023 0913   LEUKOCYTESUR Trace (A) 09/22/2023 0913    Lab Results  Component Value Date   LABMICR See below: 09/22/2023   WBCUA 0-5 09/22/2023   LABEPIT 0-10 09/22/2023   MUCUS Present (A) 03/31/2023   BACTERIA None seen 09/22/2023    Pertinent Imaging:  No results found for this or any previous visit.  No results found for this or any previous visit.  No results found for this or any previous visit.  No results found for  this or any previous visit.  No results found for this or any previous visit.  No results found for this or any previous visit.  No results found for this or any previous visit.  No results found for this or any previous visit.   Assessment & Plan:    1. Elevated PSA (Primary) Followup 6 months with PSA - Urinalysis, Routine w reflex microscopic   No follow-ups on file.  Johnie Nailer, MD  [facility]

## 2024-10-11 ENCOUNTER — Other Ambulatory Visit

## 2024-10-18 ENCOUNTER — Ambulatory Visit: Admitting: Urology

## 2025-01-21 ENCOUNTER — Other Ambulatory Visit

## 2025-01-28 ENCOUNTER — Ambulatory Visit: Admitting: Urology
